# Patient Record
Sex: Female | Born: 1955 | Race: White | Hispanic: No | Marital: Married
Health system: Southern US, Academic
[De-identification: ages and names within clinical notes are randomized; demographics above are authoritative.]

## PROBLEM LIST (undated history)

## (undated) ENCOUNTER — Ambulatory Visit: Payer: MEDICARE

## (undated) ENCOUNTER — Ambulatory Visit: Payer: Medicare (Managed Care) | Attending: Internal Medicine | Primary: Internal Medicine

## (undated) ENCOUNTER — Encounter

## (undated) ENCOUNTER — Telehealth

## (undated) ENCOUNTER — Ambulatory Visit

## (undated) ENCOUNTER — Encounter: Attending: Family Medicine | Primary: Family Medicine

## (undated) ENCOUNTER — Ambulatory Visit: Payer: Medicare (Managed Care) | Attending: Adult Health | Primary: Adult Health

## (undated) ENCOUNTER — Encounter: Attending: Adult Health | Primary: Adult Health

## (undated) ENCOUNTER — Encounter: Attending: Internal Medicine | Primary: Internal Medicine

## (undated) ENCOUNTER — Ambulatory Visit: Attending: Family Medicine | Primary: Family Medicine

## (undated) ENCOUNTER — Ambulatory Visit: Payer: Medicare (Managed Care)

## (undated) DIAGNOSIS — E0500 Thyrotoxicosis with diffuse goiter without thyrotoxic crisis or storm: Secondary | ICD-10-CM

## (undated) DIAGNOSIS — E785 Hyperlipidemia, unspecified: Secondary | ICD-10-CM

## (undated) HISTORY — PX: ROTATOR CUFF REPAIR: SHX139

## (undated) HISTORY — DX: Hyperlipidemia, unspecified: E78.5

## (undated) HISTORY — PX: ABDOMINAL HYSTERECTOMY: SHX81

## (undated) HISTORY — PX: OTHER SURGICAL HISTORY: SHX169

---

## 2007-04-07 ENCOUNTER — Ambulatory Visit: Payer: Self-pay | Admitting: Family Medicine

## 2010-06-28 ENCOUNTER — Ambulatory Visit: Payer: Self-pay | Admitting: Family Medicine

## 2011-03-15 DIAGNOSIS — N952 Postmenopausal atrophic vaginitis: Secondary | ICD-10-CM | POA: Insufficient documentation

## 2014-03-18 DIAGNOSIS — M7700 Medial epicondylitis, unspecified elbow: Secondary | ICD-10-CM | POA: Insufficient documentation

## 2014-09-13 ENCOUNTER — Encounter: Payer: Self-pay | Admitting: Family Medicine

## 2014-09-13 ENCOUNTER — Ambulatory Visit (INDEPENDENT_AMBULATORY_CARE_PROVIDER_SITE_OTHER): Payer: BC Managed Care – PPO | Admitting: Family Medicine

## 2014-09-13 ENCOUNTER — Encounter (INDEPENDENT_AMBULATORY_CARE_PROVIDER_SITE_OTHER): Payer: Self-pay

## 2014-09-13 VITALS — BP 112/72 | HR 86 | Temp 97.7°F | Resp 16 | Ht 59.0 in | Wt 140.1 lb

## 2014-09-13 DIAGNOSIS — J0100 Acute maxillary sinusitis, unspecified: Secondary | ICD-10-CM | POA: Diagnosis not present

## 2014-09-13 DIAGNOSIS — M7060 Trochanteric bursitis, unspecified hip: Secondary | ICD-10-CM | POA: Insufficient documentation

## 2014-09-13 DIAGNOSIS — M549 Dorsalgia, unspecified: Secondary | ICD-10-CM

## 2014-09-13 DIAGNOSIS — M170 Bilateral primary osteoarthritis of knee: Secondary | ICD-10-CM | POA: Insufficient documentation

## 2014-09-13 DIAGNOSIS — G8929 Other chronic pain: Secondary | ICD-10-CM | POA: Insufficient documentation

## 2014-09-13 DIAGNOSIS — N816 Rectocele: Secondary | ICD-10-CM | POA: Insufficient documentation

## 2014-09-13 DIAGNOSIS — J3089 Other allergic rhinitis: Secondary | ICD-10-CM | POA: Insufficient documentation

## 2014-09-13 DIAGNOSIS — K5790 Diverticulosis of intestine, part unspecified, without perforation or abscess without bleeding: Secondary | ICD-10-CM | POA: Insufficient documentation

## 2014-09-13 DIAGNOSIS — H7290 Unspecified perforation of tympanic membrane, unspecified ear: Secondary | ICD-10-CM | POA: Insufficient documentation

## 2014-09-13 DIAGNOSIS — B009 Herpesviral infection, unspecified: Secondary | ICD-10-CM | POA: Insufficient documentation

## 2014-09-13 DIAGNOSIS — N8184 Pelvic muscle wasting: Secondary | ICD-10-CM | POA: Insufficient documentation

## 2014-09-13 DIAGNOSIS — K449 Diaphragmatic hernia without obstruction or gangrene: Secondary | ICD-10-CM | POA: Insufficient documentation

## 2014-09-13 DIAGNOSIS — Z87442 Personal history of urinary calculi: Secondary | ICD-10-CM | POA: Insufficient documentation

## 2014-09-13 DIAGNOSIS — Z90710 Acquired absence of both cervix and uterus: Secondary | ICD-10-CM | POA: Insufficient documentation

## 2014-09-13 DIAGNOSIS — Z780 Asymptomatic menopausal state: Secondary | ICD-10-CM | POA: Insufficient documentation

## 2014-09-13 DIAGNOSIS — Q359 Cleft palate, unspecified: Secondary | ICD-10-CM | POA: Insufficient documentation

## 2014-09-13 MED ORDER — AZITHROMYCIN 500 MG PO TABS: 500.0000 mg | ORAL_TABLET | Freq: Every day | ORAL | Status: DC

## 2014-09-13 MED ORDER — HYDROCOD POLST-CPM POLST ER 10-8 MG/5ML PO SUER: 5.0000 mL | Freq: Two times a day (BID) | ORAL | Status: DC

## 2014-09-13 NOTE — Progress Notes (Signed)
Name: Mia Camacho   MRN: [id number]    DOB: [birth year]   Date:09/13/2014       Progress Note  Subjective  Chief Complaint  Chief Complaint  Patient presents with  . URI    onset 1 week, cough,ear pain and clogged,sore mouth,congestion,swollen glands    HPI  Sinusitis: she had symptoms of cold that started one week ago, symptoms are getting, facial pressure, worse when leaning forward, difficulty hearing from left side, nocturnal cough from post-nasal drainage. No fever, no chills, but she is feeling tired.    Patient Active Problem List   Diagnosis Date Noted  . Allergic rhinitis 09/13/2014  . Back pain, chronic 09/13/2014  . Diverticulosis 09/13/2014  . Bursitis, trochanteric 09/13/2014  . Herpes 09/13/2014  . Hiatal hernia 09/13/2014  . H/O: hysterectomy 09/13/2014  . Personal history of urinary calculi 09/13/2014  . 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 Hernia, rectovaginal 09/13/2014  . Primary osteoarthritis of both knees 09/13/2014  . Pelvic muscle wasting 09/13/2014  . Epicondylitis elbow, medial 03/18/2014  . LBP (low back pain) 06/12/2012  . 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 Acid reflux 06/28/2009  . Vitamin D deficiency 04/05/2009  . Dyslipidemia 02/10/2009  . History of Graves' disease 02/10/2009    Past Surgical History  Procedure Laterality Date  . Abdominal hysterectomy    . Rotator cuff repair    . Bladder tact      Family History  Problem Relation Age of Onset  . Hyperlipidemia Mother   . Hypertension Mother   . Diabetes Mother   . Cancer Father     History   Social History  . Marital Status: Married    Spouse Name: N/A  . Number of Children: N/A  . Years of Education: N/A   Occupational History  . Not on file.   Social History Main Topics  . Smoking status: Never Smoker   . Smokeless tobacco: Never Used  . Alcohol Use: No  . Drug Use: No  . Sexual Activity: Yes   Other Topics Concern  . Not on  file   Social History Narrative  . No narrative on file     Current outpatient prescriptions:  .  Cinnamon 500 MG capsule, Take 500 mg by mouth daily., Disp: , Rfl:  .  co-enzyme Q-10 50 MG capsule, Take 50 mg by mouth daily., Disp: , Rfl:  .  mometasone (NASONEX) 50 MCG/ACT nasal spray, Place 2 sprays into the nose daily., Disp: , Rfl: 6 .  Multiple Vitamin (MULTIVITAMIN) capsule, Take 1 capsule by mouth daily., Disp: , Rfl:  .  Omega-3 1000 MG CAPS, Take 1 tablet by mouth daily., Disp: , Rfl:  .  Red Yeast Rice Extract ([facility] RED YEAST RICE PO), Take by mouth., Disp: , Rfl:  .  rosuvastatin (CRESTOR) 10 MG tablet, Take 1 tablet by mouth daily., Disp: , Rfl: 2 .  vitamin E 1000 UNIT capsule, Take 1,000 Units by mouth daily., Disp: , Rfl:  .  azithromycin (ZITHROMAX) 500 MG tablet, Take 1 tablet (500 mg total) by mouth daily., Disp: 3 tablet, Rfl: 0  Allergies  Allergen Reactions  . Aspirin Rash  . Sulfa Antibiotics Rash  . Penicillins Nausea And Vomiting  . Latex Rash    Other Reaction: WELTS     ROS  Ten systems reviewed and is negative except as mentioned in HPI   Objective  Filed Vitals:   09/13/14 0946  BP:  112/72  Pulse: 86  Temp: 97.7 F (36.5 C)  TempSrc: Oral  Resp: 16  Height: 4\' 11"  (1.499 m)  Weight: 140 lb 1.6 oz (63.549 kg)  SpO2: 96%    Body mass index is 28.28 kg/(m^2).  Physical Exam  Constitutional: Patient appears well-developed and well-nourished. No distress.  Eyes:  No scleral icterus. PERL Nose: bulging turbinates Pharynx: cobblestone formation and post-nasal drainag Ears: scarring on right ear drum, and perforation on left side Sinus: tender to touch worse on right maxillary sinus Neck: Normal range of motion. Neck supple. Cardiovascular: Normal rate, regular rhythm and normal heart sounds.  No murmur heard. No BLE edema. Pulmonary/Chest: Effort normal and breath sounds normal. No respiratory distress. Abdominal: Soft.  There is no  tenderness. Psychiatric: Patient has a normal mood and affect. behavior is normal. Judgment and thought content normal.    PHQ2/9: Depression screen PHQ 2/9 09/13/2014  Decreased Interest 0  Down, Depressed, Hopeless 0  PHQ - 2 Score 0     Fall Risk: Fall Risk  09/13/2014  Falls in the past year? No     Assessment & Plan  1. Acute maxillary sinusitis, recurrence not specified Resume nasal spray, nasal saline, otc medication, cough medication at night and antibiotics   - azithromycin (ZITHROMAX) 500 MG tablet; Take 1 tablet (500 mg total) by mouth daily.  Dispense: 3 tablet; Refill: 0 - chlorpheniramine-HYDROcodone (TUSSIONEX PENNKINETIC ER) 10-8 MG/5ML SUER; Take 5 mLs by mouth 2 (two) times daily.  Dispense: 140 mL; Refill: 0

## 2014-10-18 ENCOUNTER — Ambulatory Visit (INDEPENDENT_AMBULATORY_CARE_PROVIDER_SITE_OTHER): Payer: BC Managed Care – PPO | Admitting: Family Medicine

## 2014-10-18 ENCOUNTER — Encounter: Payer: Self-pay | Admitting: Family Medicine

## 2014-10-18 VITALS — BP 122/60 | HR 97 | Temp 98.1°F | Resp 16 | Ht 59.0 in | Wt 143.9 lb

## 2014-10-18 DIAGNOSIS — J3089 Other allergic rhinitis: Secondary | ICD-10-CM

## 2014-10-18 DIAGNOSIS — Z8639 Personal history of other endocrine, nutritional and metabolic disease: Secondary | ICD-10-CM | POA: Diagnosis not present

## 2014-10-18 DIAGNOSIS — E785 Hyperlipidemia, unspecified: Secondary | ICD-10-CM | POA: Diagnosis not present

## 2014-10-18 DIAGNOSIS — Z79899 Other long term (current) drug therapy: Secondary | ICD-10-CM | POA: Diagnosis not present

## 2014-10-18 NOTE — Progress Notes (Signed)
Name: Mia Camacho   MRN: [id number]    DOB: 1955-05-19   Date:10/18/2014       Progress Note  Subjective  Chief Complaint  Chief Complaint  Patient presents with  . Medication Refill  . Hyperlipidemia    No problems with new medication 1/2 pill of Crestor daily    HPI  Hyperlipidemia: she has been tolerating Crestor , currently on half pill daily, and Co Q 10, she is also taking Red Yeast Rice, she denies myalgias at this time.   History of Graves: she has been off Methimazole for years now, we still monitor her levels, no hair loss, diarrhea, no palpitation.  AR: symptoms are usually are pruritus and congestion. Using steroid nasally prn only and seems to control symptoms.   Patient Active Problem List   Diagnosis Date Noted  . Allergic rhinitis 09/13/2014  . Back pain, chronic 09/13/2014  . Diverticulosis 09/13/2014  . Bursitis, trochanteric 09/13/2014  . Herpes 09/13/2014  . Hiatal hernia 09/13/2014  . H/O: hysterectomy 09/13/2014  . Personal history of urinary calculi 09/13/2014  . Menopause 09/13/2014  . Hernia, rectovaginal 09/13/2014  . Primary osteoarthritis of both knees 09/13/2014  . Pelvic muscle wasting 09/13/2014  . Cleft palate 09/13/2014  . 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/13/2014  . Epicondylitis elbow, medial 03/18/2014  . LBP (low back pain) 06/12/2012  . 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 Gastroesophageal reflux disease with hiatal hernia 06/28/2009  . Vitamin D deficiency 04/05/2009  . Dyslipidemia 02/10/2009  . History of Graves' disease 02/10/2009    Past Surgical History  Procedure Laterality Date  . Abdominal hysterectomy    . Rotator cuff repair    . Bladder tact      Family History  Problem Relation Age of Onset  . Hyperlipidemia Mother   . Hypertension Mother   . Diabetes Mother   . Cancer Father     Social History   Social History  . Marital Status: Married    Spouse Name: N/A  . Number of Children: N/A  . Years of  Education: N/A   Occupational History  . Not on file.   Social History Main Topics  . Smoking status: Never Smoker   . Smokeless tobacco: Never Used  . Alcohol Use: No  . Drug Use: No  . Sexual Activity:    Partners: Male   Other Topics Concern  . Not on file   Social History Narrative     Current outpatient prescriptions:  .  acetaminophen (TYLENOL) 650 MG CR tablet, Take 1 tablet by mouth 2 (two) times daily as needed., Disp: , Rfl:  .  Cholecalciferol (VITAMIN D) 2000 UNITS tablet, Take 1 tablet by mouth daily., Disp: , Rfl:  .  Cinnamon 500 MG capsule, Take 500 mg by mouth daily., Disp: , Rfl:  .  co-enzyme Q-10 50 MG capsule, Take 50 mg by mouth daily., Disp: , Rfl:  .  mometasone (NASONEX) 50 MCG/ACT nasal spray, Place 2 sprays into the nose daily., Disp: , Rfl: 6 .  Multiple Vitamin (MULTIVITAMIN) capsule, Take 1 capsule by mouth daily., Disp: , Rfl:  .  Omega-3 1000 MG CAPS, Take 1 tablet by mouth daily., Disp: , Rfl:  .  polyethylene glycol (MIRALAX / GLYCOLAX) packet, Take 1 packet by mouth daily., Disp: , Rfl:  .  rosuvastatin (CRESTOR) 10 MG tablet, Take 1 tablet by mouth daily., Disp: , Rfl: 2 .  vitamin E 1000 UNIT capsule, Take 1,000 Units by mouth  daily., Disp: , Rfl:   Allergies  Allergen Reactions  . Aspirin Rash  . Sulfa Antibiotics Rash  . Penicillins Nausea And Vomiting  . Latex Rash    Other Reaction: WELTS     ROS  Constitutional: Negative for fever or significant weight change.  Respiratory: Negative for cough and shortness of breath.   Cardiovascular: Negative for chest pain or palpitations.  Gastrointestinal: Negative for abdominal pain, no bowel changes.  Musculoskeletal: Negative for gait problem or joint swelling.  Skin: Negative for rash.  Neurological: Negative for dizziness or headache.  No other specific complaints in a complete review of systems (except as listed in HPI above).  Objective  Filed Vitals:   10/18/14 1535  BP:  122/60  Pulse: 97  Temp: 98.1 F (36.7 C)  TempSrc: Oral  Resp: 16  Height: 4\' 11"  (1.499 m)  Weight: 143 lb 14.4 oz (65.273 kg)  SpO2: 97%    Body mass index is 29.05 kg/(m^2).  Physical Exam  Constitutional: Patient appears well-developed and well-nourished. Obese  No distress.  HEENT: head atraumatic, normocephalic, pupils equal and reactive to light, neck supple, throat within normal limits, scarring in both TM  Cardiovascular: Normal rate, regular rhythm and normal heart sounds.  No murmur heard. No BLE edema. Pulmonary/Chest: Effort normal and breath sounds normal. No respiratory distress. Abdominal: Soft.  There is no tenderness. Psychiatric: Patient has a normal mood and affect. behavior is normal. Judgment and thought content normal.    PHQ2/9: Depression screen PHQ 2/9 09/13/2014  Decreased Interest 0  Down, Depressed, Hopeless 0  PHQ - 2 Score 0    Fall Risk: Fall Risk  09/13/2014  Falls in the past year? No    Assessment & Plan  1. Dyslipidemia Try to increase Crestor to 10 mg daily, if pain / muscle pain occurs back down to 5 mg daily - Lipid panel  2. History of Graves' disease  - Thyroid Panel With TSH  3. Other allergic rhinitis  Continue prn medication   4. Long-term use of high-risk medication  - Comprehensive metabolic panel

## 2014-12-30 ENCOUNTER — Telehealth: Payer: Self-pay | Admitting: Family Medicine

## 2014-12-30 NOTE — Telephone Encounter (Signed)
Pt states she let her lab order run out and wants to know if she can come in on Monday morning to get a new lab order?

## 2014-12-30 NOTE — Telephone Encounter (Signed)
Left voicemail labs slip will be up front for Monday.

## 2015-01-04 LAB — LIPID PANEL
CHOLESTEROL TOTAL: 263 mg/dL — AB (ref 100–199)
Chol/HDL Ratio: 3.7 ratio units (ref 0.0–4.4)
HDL: 72 mg/dL (ref >39)
LDL Calculated: 160 mg/dL — ABNORMAL HIGH (ref 0–99)
Triglycerides: 153 mg/dL — ABNORMAL HIGH (ref 0–149)
VLDL Cholesterol Cal: 31 mg/dL (ref 5–40)

## 2015-01-04 LAB — COMPREHENSIVE METABOLIC PANEL
ALBUMIN: 4.5 g/dL (ref 3.5–5.5)
ALT: 19 IU/L (ref 0–32)
AST: 23 IU/L (ref 0–40)
Albumin/Globulin Ratio: 1.7 (ref 1.1–2.5)
Alkaline Phosphatase: 74 IU/L (ref 39–117)
BUN / CREAT RATIO: 16 (ref 9–23)
BUN: 11 mg/dL (ref 6–24)
Bilirubin Total: 0.3 mg/dL (ref 0.0–1.2)
CALCIUM: 9.6 mg/dL (ref 8.7–10.2)
CO2: 25 mmol/L (ref 18–29)
CREATININE: 0.68 mg/dL (ref 0.57–1.00)
Chloride: 98 mmol/L (ref 97–106)
GFR calc Af Amer: 111 mL/min/{1.73_m2} (ref >59)
GFR, EST NON AFRICAN AMERICAN: 96 mL/min/{1.73_m2} (ref >59)
GLOBULIN, TOTAL: 2.6 g/dL (ref 1.5–4.5)
Glucose: 90 mg/dL (ref 65–99)
Potassium: 4.4 mmol/L (ref 3.5–5.2)
SODIUM: 140 mmol/L (ref 136–144)
Total Protein: 7.1 g/dL (ref 6.0–8.5)

## 2015-01-04 LAB — THYROID PANEL WITH TSH
FREE THYROXINE INDEX: 2.2 (ref 1.2–4.9)
T3 Uptake Ratio: 29 % (ref 24–39)
T4 TOTAL: 7.5 ug/dL (ref 4.5–12.0)
TSH: 1.63 u[IU]/mL (ref 0.450–4.500)

## 2015-01-08 ENCOUNTER — Other Ambulatory Visit: Payer: Self-pay | Admitting: Family Medicine

## 2015-01-31 ENCOUNTER — Other Ambulatory Visit: Payer: Self-pay | Admitting: Family Medicine

## 2015-01-31 NOTE — Telephone Encounter (Signed)
PT NEEDS REFILL ON ROSUVASTATIN CALCIUM 10MG . PHARM IS [facility] [place] [facility] A [facility]

## 2015-02-01 MED ORDER — ROSUVASTATIN CALCIUM 10 MG PO TABS: ORAL_TABLET | ORAL | Status: DC

## 2015-04-22 ENCOUNTER — Encounter: Payer: Self-pay | Admitting: Family Medicine

## 2015-04-22 ENCOUNTER — Ambulatory Visit (INDEPENDENT_AMBULATORY_CARE_PROVIDER_SITE_OTHER): Payer: BC Managed Care – PPO | Admitting: Family Medicine

## 2015-04-22 VITALS — BP 116/64 | HR 86 | Temp 97.6°F | Resp 16 | Ht 59.0 in | Wt 140.8 lb

## 2015-04-22 DIAGNOSIS — M170 Bilateral primary osteoarthritis of knee: Secondary | ICD-10-CM | POA: Diagnosis not present

## 2015-04-22 DIAGNOSIS — M549 Dorsalgia, unspecified: Secondary | ICD-10-CM | POA: Diagnosis not present

## 2015-04-22 DIAGNOSIS — J309 Allergic rhinitis, unspecified: Secondary | ICD-10-CM | POA: Diagnosis not present

## 2015-04-22 DIAGNOSIS — E785 Hyperlipidemia, unspecified: Secondary | ICD-10-CM | POA: Diagnosis not present

## 2015-04-22 DIAGNOSIS — G8929 Other chronic pain: Secondary | ICD-10-CM

## 2015-04-22 DIAGNOSIS — J3089 Other allergic rhinitis: Secondary | ICD-10-CM

## 2015-04-22 DIAGNOSIS — Z1211 Encounter for screening for malignant neoplasm of colon: Secondary | ICD-10-CM

## 2015-04-22 DIAGNOSIS — M7062 Trochanteric bursitis, left hip: Secondary | ICD-10-CM

## 2015-04-22 DIAGNOSIS — E7849 Other hyperlipidemia: Secondary | ICD-10-CM

## 2015-04-22 DIAGNOSIS — M7061 Trochanteric bursitis, right hip: Secondary | ICD-10-CM

## 2015-04-22 DIAGNOSIS — N8184 Pelvic muscle wasting: Secondary | ICD-10-CM

## 2015-04-22 MED ORDER — TIZANIDINE HCL 4 MG PO TABS: 4.0000 mg | ORAL_TABLET | Freq: Three times a day (TID) | ORAL | Status: DC

## 2015-04-22 MED ORDER — MELOXICAM 15 MG PO TABS: 15.0000 mg | ORAL_TABLET | Freq: Every day | ORAL | Status: DC

## 2015-04-22 MED ORDER — ROSUVASTATIN CALCIUM 10 MG PO TABS: ORAL_TABLET | ORAL | Status: DC

## 2015-04-22 MED ORDER — MOMETASONE FUROATE 50 MCG/ACT NA SUSP: 2.0000 | Freq: Every day | NASAL | Status: DC

## 2015-04-22 NOTE — Progress Notes (Signed)
Name: Mia Camacho   MRN: [id number]    DOB: [birth year]   Date:04/22/2015       Progress Note  Subjective  Chief Complaint  Chief Complaint  Patient presents with  . Hyperlipidemia    patient is here for her 72-month f/u  . Medication Refill    nasonex  . Back Pain    HPI  Hyperlipidemia: she has been tolerating Crestor 10mg , currently on half pill daily, and Co Q 10, she denies myalgias at this time. LDL down from 300's to 160's.   History of Graves: she has been off Methimazole for years now, last labs within normal limits , no hair loss, diarrhea, no palpitation.  AR: symptoms are usually are pruritus and congestion. Using steroid nasally prn only and seems to control symptoms.  Chronic Back pain: she has a long history of low back pain, used to be intermittent, however over the past 6 weeks the pain has been constant - started after she tried using an exercise bike. 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 She has intermittent tingling on her toes when she walks.   Trochanteric bursitis: she has been having problems sleeping at night because of pain on her outer hips. She took Tizanidine last night so she could sleep.    Patient Active Problem List   Diagnosis Date Noted  . Perennial allergic rhinitis 09/13/2014  . Back pain, chronic 09/13/2014  . Diverticulosis 09/13/2014  . Bursitis, trochanteric 09/13/2014  . Herpes 09/13/2014  . Hiatal hernia 09/13/2014  . H/O: hysterectomy 09/13/2014  . Personal history of urinary calculi 09/13/2014  . Menopause 09/13/2014  . Hernia, rectovaginal 09/13/2014  . Primary osteoarthritis of both knees 09/13/2014  . Pelvic muscle wasting 09/13/2014  . Cleft palate 09/13/2014  . 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/13/2014  . Epicondylitis elbow, medial 03/18/2014  . 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 Gastroesophageal reflux disease with hiatal hernia 06/28/2009  . Vitamin D deficiency 04/05/2009  . Familial  hyperlipidemia, high LDL 02/10/2009  . History of Graves' disease 02/10/2009    Past Surgical History  Procedure Laterality Date  . Abdominal hysterectomy    . Rotator cuff repair    . Bladder tact      Family History  Problem Relation Age of Onset  . Hyperlipidemia Mother   . Hypertension Mother   . Diabetes Mother   . Cancer Father     Social History   Social History  . Marital Status: Married    Spouse Name: N/A  . Number of Children: N/A  . Years of Education: N/A   Occupational History  . Not on file.   Social History Main Topics  . Smoking status: Never Smoker   . Smokeless tobacco: Never Used  . Alcohol Use: No  . Drug Use: No  . Sexual Activity:    Partners: Male   Other Topics Concern  . Not on file   Social History Narrative     Current outpatient prescriptions:  .  Cholecalciferol (VITAMIN D) 2000 UNITS tablet, Take 1 tablet by mouth daily., Disp: , Rfl:  .  Cinnamon 500 MG capsule, Take 2,000 mg by mouth daily. , Disp: , Rfl:  .  co-enzyme Q-10 50 MG capsule, Take 200 mg by mouth daily. , Disp: , Rfl:  .  mometasone (NASONEX) 50 MCG/ACT nasal spray, Place 2 sprays into the nose daily., Disp: 17 g, Rfl: 6 .  Multiple Vitamin (MULTIVITAMIN) capsule, Take 1 capsule  by mouth daily. , Disp: , Rfl:  .  Omega-3 1000 MG CAPS, Take 1,200 mg by mouth daily. , Disp: , Rfl:  .  polyethylene glycol (MIRALAX / GLYCOLAX) packet, Take 1 packet by mouth daily., Disp: , Rfl:  .  rosuvastatin (CRESTOR) 10 MG tablet, TAKE 1 TABLET BY MOUTH EVERY DAY FOR CHOLESTEROL, Disp: 90 tablet, Rfl: 1 .  tiZANidine (ZANAFLEX) 4 MG tablet, Take 1 tablet (4 mg total) by mouth 3 (three) times daily., Disp: 90 tablet, Rfl: 1 .  vitamin E 1000 UNIT capsule, Take 400 Units by mouth daily. , Disp: , Rfl:  .  meloxicam (MOBIC) 15 MG tablet, Take 1 tablet (15 mg total) by mouth daily., Disp: 90 tablet, Rfl: 0  Allergies  Allergen Reactions  . Aspirin Rash  . Sulfa Antibiotics Rash  .  Penicillins Nausea And Vomiting  . Latex Rash    Other Reaction: WELTS     ROS  Constitutional: Negative for fever , mild weight change.  Respiratory: Negative for cough and shortness of breath.   Cardiovascular: Negative for chest pain or palpitations.  Gastrointestinal: Negative for abdominal pain, no bowel changes.  Musculoskeletal: Negative for gait problem or joint swelling.  Skin: Negative for rash.  Neurological: Negative for dizziness or headache.  No other specific complaints in a complete review of systems (except as listed in HPI above).  Objective  Filed Vitals:   04/22/15 0921  BP: 116/64  Pulse: 86  Temp: 97.6 F (36.4 C)  TempSrc: Oral  Resp: 16  Height:  (1.499 m)  Weight: 140 lb 12.8 oz (63.866 kg)  SpO2: 97%    Body mass index is 28.42 kg/(m^2).  Physical Exam  Constitutional: Patient appears well-developed and well-nourished. Obese  No distress.  HEENT: head atraumatic, normocephalic, pupils equal and reactive to light,  neck supple, throat within normal limits Cardiovascular: Normal rate, regular rhythm and normal heart sounds.  No murmur heard. No BLE edema. Pulmonary/Chest: Effort normal and breath sounds normal. No respiratory distress. Abdominal: Soft.  There is no tenderness. Psychiatric: Patient has a normal mood and affect. behavior is normal. Judgment and thought content normal. Muscular Skeletal: low back pain, negative straight leg raise, pain during palpation of both trochanteric bursa  PHQ2/9: Depression screen [facility] 2/9 04/22/2015 09/13/2014  Decreased Interest 0 0  Down, Depressed, Hopeless 0 0  PHQ - 2 Score 0 0     Fall Risk: Fall Risk  04/22/2015 09/13/2014  Falls in the past year? No No     Functional Status Survey: Is the patient deaf or have difficulty hearing?: No Does the patient have difficulty seeing, even when wearing glasses/contacts?: Yes (patient will be seeing her eye doctor due to current rx glasses are not  helping) Does the patient have difficulty concentrating, remembering, or making decisions?: No Does the patient have difficulty walking or climbing stairs?: No Does the patient have difficulty dressing or bathing?: No Does the patient have difficulty doing errands alone such as visiting a doctor's office or shopping?: No    Assessment & Plan  1. Back pain, chronic  - tiZANidine (ZANAFLEX) 4 MG tablet; Take 1 tablet (4 mg total) by mouth 3 (three) times daily.  Dispense: 90 tablet; Refill: 1  2. Trochanteric bursitis of both hips  Bilaterally, discussed steroid injection, but she would try Meloxicam first, and if no improvement she will call back   3. Familial hyperlipidemia, high LDL  - rosuvastatin (CRESTOR) 10 MG tablet; TAKE 1  TABLET BY MOUTH EVERY DAY FOR CHOLESTEROL  Dispense: 90 tablet; Refill: 1  4. Primary osteoarthritis of both knees  - meloxicam (MOBIC) 15 MG tablet; Take 1 tablet (15 mg total) by mouth daily.  Dispense: 90 tablet; Refill: 0  5. Perennial allergic rhinitis  - mometasone (NASONEX) 50 MCG/ACT nasal spray; Place 2 sprays into the nose daily.  Dispense: 17 g; Refill: 6  6. Pelvic muscle wasting  Seen by Urologist, offered Pessary or surgery, but she has been doing well, using Miralax and no longer has problems with bowel movements.

## 2015-04-22 NOTE — Patient Instructions (Signed)
Trochanteric Bursitis You have hip pain due to trochanteric bursitis. Bursitis means that the sack near the outside of the hip is filled with fluid and inflamed. This sack is made up of protective soft tissue. The pain from trochanteric bursitis can be severe and keep you from sleep. It can radiate to the buttocks or down the outside of the thigh to the knee. The pain is almost always worse when rising from the seated or lying position and with walking. Pain can improve after you take a few steps. It happens more often in people with hip joint and lumbar spine problems, such as arthritis or previous surgery. Very rarely the trochanteric bursa can become infected, and antibiotics and/or surgery may be needed. Treatment often includes an injection of local anesthetic mixed with cortisone medicine. This medicine is injected into the area where it is most tender over the hip. Repeat injections may be necessary if the response to treatment is slow. You can apply ice packs over the tender area for 30 minutes every 2 hours for the next few days. Anti-inflammatory and/or narcotic pain medicine may also be helpful. Limit your activity for the next few days if the pain continues. See your caregiver in 5-10 days if you are not greatly improved.  SEEK IMMEDIATE MEDICAL CARE IF:  You develop severe pain, fever, or increased redness.  You have pain that radiates below the knee. EXERCISES STRETCHING EXERCISES - Trochanteric Bursitis  These exercises may help you when beginning to rehabilitate your injury. Your symptoms may resolve with or without further involvement from your physician, physical therapist, or athletic trainer. While completing these exercises, remember:   Restoring tissue flexibility helps normal motion to return to the joints. This allows healthier, less painful movement and activity.  An effective stretch should be held for at least 30 seconds.  A stretch should never be painful. You should only  feel a gentle lengthening or release in the stretched tissue. STRETCH - Iliotibial Band  On the floor or bed, lie on your side so your injured leg is on top. Bend your knee and grab your ankle.  Slowly bring your knee back so that your thigh is in line with your trunk. Keep your heel at your buttocks and gently arch your back so your head, shoulders and hips line up.  Slowly lower your leg so that your knee approaches the floor/bed until you feel a gentle stretch on the outside of your thigh. If you do not feel a stretch and your knee will not fall farther, place the heel of your opposite foot on top of your knee and pull your thigh down farther.  Hold this stretch for __________ seconds.  Repeat __________ times. Complete this exercise __________ times per day. STRETCH - Hamstrings, Supine   Lie on your back. Loop a belt or towel over the ball of your foot as shown.  Straighten your knee and slowly pull on the belt to raise your injured leg. Do not allow the knee to bend. Keep your opposite leg flat on the floor.  Raise the leg until you feel a gentle stretch behind your knee or thigh. Hold this position for __________ seconds.  Repeat __________ times. Complete this stretch __________ times per day. STRETCH - Quadriceps, Prone   Lie on your stomach on a firm surface, such as a bed or padded floor.  Bend your knee and grasp your ankle. If you are unable to reach your ankle or pant leg, use a belt   around your foot to lengthen your reach.  Gently pull your heel toward your buttocks. Your knee should not slide out to the side. You should feel a stretch in the front of your thigh and/or knee.  Hold this position for __________ seconds.  Repeat __________ times. Complete this stretch __________ times per day. STRETCHING - Hip Flexors, Lunge Half kneel with your knee on the floor and your opposite knee bent and directly over your ankle.  Keep good posture with your head over your  shoulders. Tighten your buttocks to point your tailbone downward; this will prevent your back from arching too much.  You should feel a gentle stretch in the front of your thigh and/or hip. If you do not feel any resistance, slightly slide your opposite foot forward and then slowly lunge forward so your knee once again lines up over your ankle. Be sure your tailbone remains pointed downward.  Hold this stretch for __________ seconds.  Repeat __________ times. Complete this stretch __________ times per day. STRETCH - Adductors, Lunge  While standing, spread your legs.  Lean away from your injured leg by bending your opposite knee. You may rest your hands on your thigh for balance.  You should feel a stretch in your inner thigh. Hold for __________ seconds.  Repeat __________ times. Complete this exercise __________ times per day.   This information is not intended to replace advice given to you by your health care provider. Make sure you discuss any questions you have with your health care provider.   Document Released: 03/29/2004 Document Revised: 07/06/2014 Document Reviewed: 06/03/2008 Elsevier Interactive Patient Education 2016 [employer] Inc[employer] Hip Bursitis Bursitis is a swelling and soreness (inflammation) of a fluid-filled sac (bursa). This sac overlies and protects the joints.  CAUSES   Injury.  Overuse of the muscles surrounding the joint.  Arthritis.  Gout.  Infection.  Cold weather.  Inadequate warm-up and conditioning prior to activities. The cause may not be known.  SYMPTOMS   Mild to severe irritation.  Tenderness and swelling over the outside of the hip.  Pain with motion of the hip.  If the bursa becomes infected, a fever may be present. Redness, tenderness, and warmth will develop over the hip. Symptoms usually lessen in 3 to 4 weeks with treatment, but can come back. TREATMENT If conservative treatment does not work, your caregiver may advise draining  the bursa and injecting cortisone into the area. This may speed up the healing process. This may also be used as an initial treatment of choice. HOME CARE INSTRUCTIONS   Apply ice to the affected area for 15-20 minutes every 3 to 4 hours while awake for the first 2 days. Put the ice in a plastic bag and place a towel between the bag of ice and your skin.  Rest the painful joint as much as possible, but continue to put the joint through a normal range of motion at least 4 times per day. When the pain lessens, begin normal, slow movements and usual activities to help prevent stiffness of the hip.  Only take over-the-counter or prescription medicines for pain, discomfort, or fever as directed by your caregiver.  Use crutches to limit weight bearing on the hip joint, if advised.  Elevate your painful hip to reduce swelling. Use pillows for propping and cushioning your legs and hips.  Gentle massage may provide comfort and decrease swelling. SEEK IMMEDIATE MEDICAL CARE IF:   Your pain increases even during treatment, or you are not improving.  You have a fever.  You have heat and inflammation over the involved bursa.  You have any other questions or concerns. MAKE SURE YOU:   Understand these instructions.  Will watch your condition.  Will get help right away if you are not doing well or get worse.   This information is not intended to replace advice given to you by your health care provider. Make sure you discuss any questions you have with your health care provider.   Document Released: 08/11/2001 Document Revised: 05/14/2011 Document Reviewed: 09/21/2014 Elsevier Interactive Patient Education [employer]

## 2015-05-20 ENCOUNTER — Telehealth: Payer: Self-pay | Admitting: Family Medicine

## 2015-05-20 NOTE — Telephone Encounter (Signed)
Patient was referred to Dr. Servando SnareWohl for a colonoscopy, but she contacted her insurance company and the out of pocket will be to much.  Therefore she would like to go to [facility] to see either Dr. Mechele CollinElliott or Dr. Bluford Kaufmannh (if possible).  Per patient insurance will cover at [facility] [phone] ([facility] #).  Patient also stated that she has rectocele.  Please contact patient once the referral has been reprocessed.

## 2015-05-25 NOTE — Telephone Encounter (Signed)
Notified patient I have sent her referral to [facility] asking them to schedule her colonoscopy at [facility], [facility], [facility]) and they should contact her to schedule the appt.

## 2015-07-22 ENCOUNTER — Encounter: Payer: Self-pay | Admitting: Family Medicine

## 2015-07-22 ENCOUNTER — Ambulatory Visit (INDEPENDENT_AMBULATORY_CARE_PROVIDER_SITE_OTHER): Payer: BC Managed Care – PPO | Admitting: Family Medicine

## 2015-07-22 VITALS — BP 106/64 | HR 90 | Temp 97.9°F | Resp 18 | Ht 59.0 in | Wt 140.4 lb

## 2015-07-22 DIAGNOSIS — Z1211 Encounter for screening for malignant neoplasm of colon: Secondary | ICD-10-CM

## 2015-07-22 DIAGNOSIS — Z0000 Encounter for general adult medical examination without abnormal findings: Secondary | ICD-10-CM

## 2015-07-22 DIAGNOSIS — Z1239 Encounter for other screening for malignant neoplasm of breast: Secondary | ICD-10-CM | POA: Diagnosis not present

## 2015-07-22 DIAGNOSIS — Z01419 Encounter for gynecological examination (general) (routine) without abnormal findings: Secondary | ICD-10-CM

## 2015-07-22 NOTE — Progress Notes (Signed)
Name: Mia Camacho   MRN: [id number]    DOB: 06-Jan-1956   Date:07/22/2015       Progress Note  Subjective  Chief Complaint  Chief Complaint  Patient presents with  . Annual Exam    HPI  Well Woman: she is feeling good, still has a rectocele, no urinary symptoms. Due for mammogram, and will have colonoscopy done by Dr. Mechele CollinElliott on July 26th, 2017. She has hot flashes and night sweats but controlled with dietary modification -avoid spicy food and caffeine  Patient Active Problem List   Diagnosis Date Noted  . Perennial allergic rhinitis 09/13/2014  . Back pain, chronic 09/13/2014  . Diverticulosis 09/13/2014  . Bursitis, trochanteric 09/13/2014  . Herpes 09/13/2014  . Hiatal hernia 09/13/2014  . H/O: hysterectomy 09/13/2014  . Personal history of urinary calculi 09/13/2014  . Menopause 09/13/2014  . Hernia, rectovaginal 09/13/2014  . Primary osteoarthritis of both knees 09/13/2014  . Pelvic muscle wasting 09/13/2014  . Cleft palate 09/13/2014  . 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/13/2014  . Epicondylitis elbow, medial 03/18/2014  . 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 Gastroesophageal reflux disease with hiatal hernia 06/28/2009  . Vitamin D deficiency 04/05/2009  . Familial hyperlipidemia, high LDL 02/10/2009  . History of Graves' disease 02/10/2009    Past Surgical History  Procedure Laterality Date  . Abdominal hysterectomy    . Rotator cuff repair    . Bladder tact      Family History  Problem Relation Age of Onset  . Hyperlipidemia Mother   . Hypertension Mother   . Diabetes Mother   . Cancer Father     Social History   Social History  . Marital Status: Married    Spouse Name: N/A  . Number of Children: N/A  . Years of Education: N/A   Occupational History  . Not on file.   Social History Main Topics  . Smoking status: Never Smoker   . Smokeless tobacco: Never Used  . Alcohol Use: No  . Drug Use: No  . Sexual Activity:    Partners: Male   Other  Topics Concern  . Not on file   Social History Narrative     Current outpatient prescriptions:  .  Cholecalciferol (VITAMIN D) 2000 UNITS tablet, Take 1 tablet by mouth daily., Disp: , Rfl:  .  co-enzyme Q-10 50 MG capsule, Take 200 mg by mouth daily. , Disp: , Rfl:  .  meloxicam (MOBIC) 15 MG tablet, Take 1 tablet (15 mg total) by mouth daily., Disp: 90 tablet, Rfl: 0 .  mometasone (NASONEX) 50 MCG/ACT nasal spray, Place 2 sprays into the nose daily., Disp: 17 g, Rfl: 6 .  Multiple Vitamin (MULTIVITAMIN) capsule, Take 1 capsule by mouth daily. , Disp: , Rfl:  .  Omega-3 1000 MG CAPS, Take 1,200 mg by mouth daily. , Disp: , Rfl:  .  polyethylene glycol (MIRALAX / GLYCOLAX) packet, Take 1 packet by mouth daily., Disp: , Rfl:  .  rosuvastatin (CRESTOR) 10 MG tablet, TAKE 1 TABLET BY MOUTH EVERY DAY FOR CHOLESTEROL, Disp: 90 tablet, Rfl: 1 .  tiZANidine (ZANAFLEX) 4 MG tablet, Take 1 tablet (4 mg total) by mouth 3 (three) times daily., Disp: 90 tablet, Rfl: 1 .  vitamin E 1000 UNIT capsule, Take 400 Units by mouth daily. , Disp: , Rfl:   Allergies  Allergen Reactions  . Aspirin Rash  . Sulfa Antibiotics Rash  . Penicillins Nausea And Vomiting  . Latex Rash  Other Reaction: WELTS     ROS  Constitutional: Negative for fever or weight change.  Respiratory: Negative for cough and shortness of breath.   Cardiovascular: Negative for chest pain or palpitations.  Gastrointestinal: Negative for abdominal pain, no bowel changes. Occasionally has dysphagia and will discuss with Dr. Mechele Collin Musculoskeletal: Negative for gait problem or joint swelling.  Skin: Negative for rash.  Neurological: Negative for dizziness or headache.  No other specific complaints in a complete review of systems (except as listed in HPI above).  Objective  Filed Vitals:   07/22/15 0842  BP: 106/64  Pulse: 90  Temp: 97.9 F (36.6 C)  TempSrc: Oral  Resp: 18  Height:  (1.499 m)  Weight: 140 lb 6.4  oz (63.685 kg)  SpO2: 98%    Body mass index is 28.34 kg/(m^2).  Physical Exam  Constitutional: Patient appears well-developed and obese. No distress.  HENT: Head: Normocephalic and atraumatic. Ears: B TMs ok, no erythema or effusion; Nose: Nose normal. Mouth/Throat: Oropharynx is clear and moist. No oropharyngeal exudate.  Eyes: Conjunctivae and EOM are normal. Pupils are equal, round, and reactive to light. No scleral icterus.  Neck: Normal range of motion. Neck supple. No JVD present. No thyromegaly present.  Cardiovascular: Normal rate, regular rhythm and normal heart sounds.  No murmur heard. No BLE edema. Pulmonary/Chest: Effort normal and breath sounds normal. No respiratory distress. Abdominal: Soft. Bowel sounds are normal, no distension. There is no tenderness. no masses Breast: no lumps or masses, no nipple discharge or rashes FEMALE GENITALIA:  External genitalia normal External urethra normal Vaginal va ult normal without discharge or lesions Cervix normal without discharge or lesions Bimanual exam normal without masses Cervix absent, large rectocele RECTAL: not done Musculoskeletal: Normal range of motion, no joint effusions. No gross deformities Neurological: he is alert and oriented to person, place, and time. No cranial nerve deficit. Coordination, balance, strength, speech and gait are normal.  Skin: Skin is warm and dry. No rash noted. No erythema.  Psychiatric: Patient has a normal mood and affect. behavior is normal. Judgment and thought content normal.  PHQ2/9: Depression screen [facility] 2/9 07/22/2015 04/22/2015 09/13/2014  Decreased Interest 0 0 0  Down, Depressed, Hopeless 0 0 0  PHQ - 2 Score 0 0 0     Fall Risk: Fall Risk  07/22/2015 04/22/2015 09/13/2014  Falls in the past year? No No No    Functional Status Survey: Is the patient deaf or have difficulty hearing?: No Does the patient have difficulty seeing, even when wearing glasses/contacts?: No Does the  patient have difficulty concentrating, remembering, or making decisions?: No Does the patient have difficulty walking or climbing stairs?: No Does the patient have difficulty dressing or bathing?: No Does the patient have difficulty doing errands alone such as visiting a doctor's office or shopping?: No    Assessment & Plan  1. Well woman exam  Discussed importance of 150 minutes of physical activity weekly, eat two servings of fish weekly, eat one serving of tree nuts ( cashews, pistachios, pecans, almonds.Marland Kitchen) every other day, eat 6 servings of fruit/vegetables daily and drink plenty of water and avoid sweet beverages.   2. Breast cancer screening  - MM Digital Screening; Future  3. Encounter for screening colonoscopy  She has an appointment with Dr. Mechele Collin

## 2015-08-19 ENCOUNTER — Other Ambulatory Visit: Payer: Self-pay | Admitting: Family Medicine

## 2015-09-21 ENCOUNTER — Other Ambulatory Visit: Payer: Self-pay | Admitting: Unknown Physician Specialty

## 2015-09-21 DIAGNOSIS — R1310 Dysphagia, unspecified: Secondary | ICD-10-CM

## 2015-09-26 ENCOUNTER — Ambulatory Visit
Admission: RE | Admit: 2015-09-26 | Discharge: 2015-09-26 | Disposition: A | Discharge status: Home / Self Care | Payer: BC Managed Care – PPO | Source: Ambulatory Visit | Attending: Unknown Physician Specialty | Admitting: Unknown Physician Specialty

## 2015-09-26 DIAGNOSIS — R1310 Dysphagia, unspecified: Secondary | ICD-10-CM | POA: Diagnosis present

## 2015-09-26 DIAGNOSIS — K219 Gastro-esophageal reflux disease without esophagitis: Secondary | ICD-10-CM | POA: Diagnosis not present

## 2015-09-28 LAB — HM COLONOSCOPY

## 2015-10-25 ENCOUNTER — Other Ambulatory Visit: Payer: Self-pay | Admitting: Family Medicine

## 2015-10-25 ENCOUNTER — Other Ambulatory Visit: Payer: Self-pay

## 2015-10-25 ENCOUNTER — Ambulatory Visit (INDEPENDENT_AMBULATORY_CARE_PROVIDER_SITE_OTHER): Payer: BC Managed Care – PPO | Admitting: Family Medicine

## 2015-10-25 ENCOUNTER — Encounter: Payer: Self-pay | Admitting: Family Medicine

## 2015-10-25 ENCOUNTER — Telehealth: Payer: Self-pay

## 2015-10-25 VITALS — BP 108/68 | HR 86 | Temp 98.6°F | Resp 16 | Ht 59.5 in | Wt 141.2 lb

## 2015-10-25 DIAGNOSIS — Z8639 Personal history of other endocrine, nutritional and metabolic disease: Secondary | ICD-10-CM

## 2015-10-25 DIAGNOSIS — E7849 Other hyperlipidemia: Secondary | ICD-10-CM

## 2015-10-25 DIAGNOSIS — M79661 Pain in right lower leg: Secondary | ICD-10-CM | POA: Diagnosis not present

## 2015-10-25 DIAGNOSIS — M25561 Pain in right knee: Secondary | ICD-10-CM

## 2015-10-25 DIAGNOSIS — Z79899 Other long term (current) drug therapy: Secondary | ICD-10-CM

## 2015-10-25 DIAGNOSIS — E785 Hyperlipidemia, unspecified: Secondary | ICD-10-CM

## 2015-10-25 NOTE — Telephone Encounter (Signed)
Patient states Dr. Carlynn PurlSowles usually checks her Thyroid and Cholesterol levels through blood and would like her blood work done before her next appointment. Please order and print, and I will mail lab slip to patient house.

## 2015-10-25 NOTE — Progress Notes (Signed)
Name: Mia Camacho   MRN: [id number]    DOB: January 11, 1956   Date:10/25/2015       Progress Note  Subjective  Chief Complaint  Chief Complaint  Patient presents with  . Leg Pain    Onset-2 weeks, right lower leg pain on the side of her knee. Patient denies any trauma to area and states it will happen intermittently causing sharp, stinging pain to the area. Patient states it has become increasing frequent and wanted to be checked out.     HPI  She states that over the past 6 months she has noticed a dull pain on right popliteal fossa when walking for a prolonged period of time, also some tightness when flexing her right knee, and occasional right knee swelling. Over the past two weeks pain has been on the right lateral upper calf, near the knee, stinging like and initially was brief and yesterday it was not as intense but lasted for hours, so she decided to come in to be evaluated.   Patient Active Problem List   Diagnosis Date Noted  . Perennial allergic rhinitis 09/13/2014  . Back pain, chronic 09/13/2014  . Diverticulosis 09/13/2014  . Bursitis, trochanteric 09/13/2014  . Herpes 09/13/2014  . Hiatal hernia 09/13/2014  . H/O: hysterectomy 09/13/2014  . Personal history of urinary calculi 09/13/2014  . Menopause 09/13/2014  . Hernia, rectovaginal 09/13/2014  . Primary osteoarthritis of both knees 09/13/2014  . Pelvic muscle wasting 09/13/2014  . Cleft palate 09/13/2014  . 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/13/2014  . Epicondylitis elbow, medial 03/18/2014  . 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 Gastroesophageal reflux disease with hiatal hernia 06/28/2009  . Vitamin D deficiency 04/05/2009  . Familial hyperlipidemia, high LDL 02/10/2009  . History of Graves' disease 02/10/2009    Past Surgical History:  Procedure Laterality Date  . ABDOMINAL HYSTERECTOMY    . bladder tact    . ROTATOR CUFF REPAIR      Family History  Problem Relation Age of Onset  . Hyperlipidemia Mother   .  Hypertension Mother   . Diabetes Mother   . Cancer Father     Social History   Social History  . Marital status: Married    Spouse name: N/A  . Number of children: N/A  . Years of education: N/A   Occupational History  . Not on file.   Social History Main Topics  . Smoking status: Never Smoker  . Smokeless tobacco: Never Used  . Alcohol use No  . Drug use: No  . Sexual activity: Yes    Partners: Male   Other Topics Concern  . Not on file   Social History Narrative  . No narrative on file     Current Outpatient Prescriptions:  .  co-enzyme Q-10 50 MG capsule, Take 200 mg by mouth daily. , Disp: , Rfl:  .  meloxicam (MOBIC) 15 MG tablet, TAKE 1 TABLET (15 MG TOTAL) BY MOUTH DAILY., Disp: 90 tablet, Rfl: 0 .  mometasone (NASONEX) 50 MCG/ACT nasal spray, Place 2 sprays into the nose daily., Disp: 17 g, Rfl: 6 .  Multiple Vitamin (MULTIVITAMIN) capsule, Take 1 capsule by mouth daily. , Disp: , Rfl:  .  Omega-3 1000 MG CAPS, Take 1,200 mg by mouth daily. , Disp: , Rfl:  .  polyethylene glycol (MIRALAX / GLYCOLAX) packet, Take 1 packet by mouth daily., Disp: , Rfl:  .  rosuvastatin (CRESTOR) 10 MG tablet, TAKE 1 TABLET BY MOUTH EVERY DAY FOR CHOLESTEROL,  Disp: 90 tablet, Rfl: 1 .  tiZANidine (ZANAFLEX) 4 MG tablet, Take 1 tablet (4 mg total) by mouth 3 (three) times daily., Disp: 90 tablet, Rfl: 1 .  vitamin E 1000 UNIT capsule, Take 400 Units by mouth daily. , Disp: , Rfl:   Allergies  Allergen Reactions  . Aspirin Rash  . Sulfa Antibiotics Rash  . Penicillins Nausea And Vomiting  . Latex Rash    Other Reaction: WELTS     ROS  Ten systems reviewed and is negative except as mentioned in HPI   Objective  Vitals:   10/25/15 1548  BP: 108/68  Pulse: 86  Resp: 16  Temp: 98.6 F (37 C)  TempSrc: Oral  SpO2: 97%  Weight: 141 lb 3.2 oz (64 kg)  Height: 4' 11.5" (1.511 m)    Body mass index is 28.04 kg/m.  Physical Exam  Constitutional: Patient appears  well-developed and well-nourished. Obese  No distress.  HEENT: head atraumatic, normocephalic,neck supple, throat within normal limits Cardiovascular: Normal rate, regular rhythm and normal heart sounds.  No murmur heard. No BLE edema. Pulmonary/Chest: Effort normal and breath sounds normal. No respiratory distress. Abdominal: Soft.  There is no tenderness. Psychiatric: Patient has a normal mood and affect. behavior is normal. Judgment and thought content normal. Muscular Skeletal: mild effusion of knee, no redness, pain during McMurray maneuver on the lateral aspect, no calf pain during palpation, no leg edema  Recent Results (from the past 2160 hour(s))  [facility] COLONOSCOPY     Status: None   Collection Time: 09/28/15 12:00 AM  Result Value Ref Range   [facility] Colonoscopy Patient Reported See Report (in chart), Patient Reported    Comment: Normal-Dr. Kathrynn RunningElliott [facility]      PHQ2/9: [facility] 2/9 10/25/2015 07/22/2015 04/22/2015 09/13/2014  Decreased Interest 0 0 0 0  Down, Depressed, Hopeless 0 0 0 0  PHQ - 2 Score 0 0 0 0     Fall Risk: Fall Risk  10/25/2015 07/22/2015 04/22/2015 09/13/2014  Falls in the past year? No No No No     Functional Status Survey: Is the patient deaf or have difficulty hearing?: No Does the patient have difficulty seeing, even when wearing glasses/contacts?: No Does the patient have difficulty concentrating, remembering, or making decisions?: No Does the patient have difficulty walking or climbing stairs?: No Does the patient have difficulty dressing or bathing?: No Does the patient have difficulty doing errands alone such as visiting a doctor's office or shopping?: No    Assessment & Plan  1. Right calf pain  Normal exam   2. Right knee pain  OA versus meniscal tear , continue meloxicam and referral to Ortho ( discussed risk of GI/kidney side effects with meloxicam and importance of taking the lowest dose for the shortest period of  time) - Ambulatory referral to Orthopedic Surgery

## 2015-10-25 NOTE — Telephone Encounter (Signed)
done

## 2015-10-25 NOTE — Telephone Encounter (Signed)
Mailed lab slip to patient

## 2015-11-18 ENCOUNTER — Encounter: Payer: Self-pay | Admitting: Family Medicine

## 2015-11-22 DIAGNOSIS — M25561 Pain in right knee: Secondary | ICD-10-CM | POA: Insufficient documentation

## 2015-11-25 ENCOUNTER — Encounter: Payer: Self-pay | Admitting: Family Medicine

## 2015-11-25 ENCOUNTER — Ambulatory Visit (INDEPENDENT_AMBULATORY_CARE_PROVIDER_SITE_OTHER): Payer: BC Managed Care – PPO | Admitting: Family Medicine

## 2015-11-25 VITALS — BP 120/74 | HR 89 | Temp 98.3°F | Resp 18 | Ht 60.0 in | Wt 140.6 lb

## 2015-11-25 DIAGNOSIS — J309 Allergic rhinitis, unspecified: Secondary | ICD-10-CM | POA: Diagnosis not present

## 2015-11-25 DIAGNOSIS — M549 Dorsalgia, unspecified: Secondary | ICD-10-CM

## 2015-11-25 DIAGNOSIS — M25561 Pain in right knee: Secondary | ICD-10-CM | POA: Diagnosis not present

## 2015-11-25 DIAGNOSIS — J3089 Other allergic rhinitis: Secondary | ICD-10-CM

## 2015-11-25 DIAGNOSIS — E7849 Other hyperlipidemia: Secondary | ICD-10-CM

## 2015-11-25 DIAGNOSIS — G8929 Other chronic pain: Secondary | ICD-10-CM | POA: Diagnosis not present

## 2015-11-25 DIAGNOSIS — E785 Hyperlipidemia, unspecified: Secondary | ICD-10-CM

## 2015-11-25 DIAGNOSIS — Z8639 Personal history of other endocrine, nutritional and metabolic disease: Secondary | ICD-10-CM | POA: Diagnosis not present

## 2015-11-25 MED ORDER — ROSUVASTATIN CALCIUM 10 MG PO TABS: ORAL_TABLET | ORAL | 1 refills | Status: DC

## 2015-11-25 NOTE — Progress Notes (Signed)
Name: Mia Camacho   MRN: [id number]    DOB: October 17, 1955   Date:11/25/2015       Progress Note  Subjective  Chief Complaint  Chief Complaint  Patient presents with  . Hyperlipidemia    4 mnth follow up   . Follow-up    Thyroid lab results     HPI  Hyperlipidemia: she has been tolerating Crestor 10mg , currently on half pill daily, and Co Q 10, she denies myalgias at this time. LDL down from 300's to 132. She is doing well.   History of Graves: she has been off Methimazole for years now, last labs within normal limits, no hair loss, diarrhea, no palpitation. She has noticed some palpitation lately, any time of the day, almost daily - she states not associated with chest pain or SOB - she has been under more stress, mother has Alzheimer's and is getting a little worse  AR: symptoms are usually are pruritus and congestion. Using steroid nasally prn only and seems to control symptoms.  Chronic Back pain: she has a long history of low back pain, she states pain is daily, but not very intense, average of 2/10. Taking medication prn. She states tingling on her toes has resolved  Right knee pain: seen by Ortho at [facility] and had normal x-rays, taking Meloxicam prn, and knee pain has improved  Patient Active Problem List   Diagnosis Date Noted  . Acute pain of right knee 11/22/2015  . Perennial allergic rhinitis 09/13/2014  . Back pain, chronic 09/13/2014  . Diverticulosis 09/13/2014  . Bursitis, trochanteric 09/13/2014  . Herpes 09/13/2014  . Hiatal hernia 09/13/2014  . H/O: hysterectomy 09/13/2014  . Personal history of urinary calculi 09/13/2014  . Menopause 09/13/2014  . Hernia, rectovaginal 09/13/2014  . Primary osteoarthritis of both knees 09/13/2014  . Pelvic muscle wasting 09/13/2014  . Cleft palate 09/13/2014  . 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/13/2014  . Epicondylitis elbow, medial 03/18/2014  . 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 Gastroesophageal reflux disease with hiatal  hernia 06/28/2009  . Vitamin D deficiency 04/05/2009  . Familial hyperlipidemia, high LDL 02/10/2009  . History of Graves' disease 02/10/2009    Past Surgical History:  Procedure Laterality Date  . ABDOMINAL HYSTERECTOMY    . bladder tact    . ROTATOR CUFF REPAIR      Family History  Problem Relation Age of Onset  . Hyperlipidemia Mother   . Hypertension Mother   . Diabetes Mother   . Cancer Father     Social History   Social History  . Marital status: Married    Spouse name: N/A  . Number of children: N/A  . Years of education: N/A   Occupational History  . Not on file.   Social History Main Topics  . Smoking status: Never Smoker  . Smokeless tobacco: Never Used  . Alcohol use No  . Drug use: No  . Sexual activity: Yes    Partners: Male   Other Topics Concern  . Not on file   Social History Narrative  . No narrative on file     Current Outpatient Prescriptions:  .  co-enzyme Q-10 50 MG capsule, Take 200 mg by mouth daily. , Disp: , Rfl:  .  meloxicam (MOBIC) 15 MG tablet, TAKE 1 TABLET (15 MG TOTAL) BY MOUTH DAILY., Disp: 90 tablet, Rfl: 0 .  mometasone (NASONEX) 50 MCG/ACT nasal spray, Place 2 sprays into the nose daily., Disp: 17 g, Rfl: 6 .  Multiple  Vitamin (MULTIVITAMIN) capsule, Take 1 capsule by mouth daily. , Disp: , Rfl:  .  Omega-3 1000 MG CAPS, Take 1,200 mg by mouth daily. , Disp: , Rfl:  .  polyethylene glycol (MIRALAX / GLYCOLAX) packet, Take 1 packet by mouth daily., Disp: , Rfl:  .  rosuvastatin (CRESTOR) 10 MG tablet, TAKE 1 TABLET BY MOUTH EVERY DAY FOR CHOLESTEROL, Disp: 90 tablet, Rfl: 1 .  tiZANidine (ZANAFLEX) 4 MG tablet, Take 1 tablet (4 mg total) by mouth 3 (three) times daily., Disp: 90 tablet, Rfl: 1 .  vitamin E 1000 UNIT capsule, Take 400 Units by mouth daily. , Disp: , Rfl:   Allergies  Allergen Reactions  . Aspirin Rash  . Sulfa Antibiotics Rash  . Penicillins Nausea And Vomiting  . Latex Rash    Other Reaction: WELTS      ROS  Constitutional: Negative for fever or weight change.  Respiratory: Negative for cough and shortness of breath.   Cardiovascular: Negative for chest pain or palpitations.  Gastrointestinal: Negative for abdominal pain, no bowel changes.  Musculoskeletal: Negative for gait problem or joint swelling.  Skin: Negative for rash.  Neurological: Negative for dizziness or headache.  No other specific complaints in a complete review of systems (except as listed in HPI above).  Objective  Vitals:   11/25/15 0810  BP: 120/74  Pulse: 89  Resp: 18  Temp: 98.3 F (36.8 C)  TempSrc: Oral  SpO2: 98%  Weight: 140 lb 9 oz (63.8 kg)  Height: 5' (1.524 m)    Body mass index is 27.45 kg/m.  Physical Exam  Constitutional: Patient appears well-developed and well-nourished. Obese  No distress.  HEENT: head atraumatic, normocephalic, pupils equal and reactive to light, neck supple, throat within normal limits Cardiovascular: Normal rate, regular rhythm and normal heart sounds.  No murmur heard. No BLE edema. Pulmonary/Chest: Effort normal and breath sounds normal. No respiratory distress. Abdominal: Soft.  There is no tenderness. Psychiatric: Patient has a normal mood and affect. behavior is normal. Judgment and thought content normal. Muscular Skeletal: normal back exam, negative straight leg raise   Recent Results (from the past 2160 hour(s))  HM COLONOSCOPY     Status: None   Collection Time: 09/28/15 12:00 AM  Result Value Ref Range   [facility] Colonoscopy Patient Reported See Report (in chart), Patient Reported    Comment: Normal-[facility]     [facility]9: [facility] 11/25/2015 10/25/2015 07/22/2015 04/22/2015 09/13/2014  Decreased Interest 0 0 0 0 0  Down, Depressed, Hopeless 0 0 0 0 0  PHQ - 2 Score 0 0 0 0 0    Fall Risk: Fall Risk  11/25/2015 10/25/2015 07/22/2015 04/22/2015 09/13/2014  Falls in the past year? No No No No No     Functional  Status Survey: Is the patient deaf or have difficulty hearing?: No Does the patient have difficulty seeing, even when wearing glasses/contacts?: Yes Does the patient have difficulty concentrating, remembering, or making decisions?: No Does the patient have difficulty walking or climbing stairs?: No Does the patient have difficulty dressing or bathing?: No Does the patient have difficulty doing errands alone such as visiting a doctor's office or shopping?: No    Assessment & Plan  1. Familial hyperlipidemia, high LDL  - rosuvastatin (CRESTOR) 10 MG tablet; TAKE 1 TABLET BY MOUTH EVERY DAY FOR CHOLESTEROL  Dispense: 90 tablet; Refill: 1  2. Right knee pain  Keep follow up   3. Back pain, chronic  Discussed exercises, continue medications prn   4. Perennial allergic rhinitis  Continue nasal steroids   5. History of Graves' disease  Normal TSH

## 2015-11-26 ENCOUNTER — Other Ambulatory Visit: Payer: Self-pay | Admitting: Family Medicine

## 2015-11-26 DIAGNOSIS — E7849 Other hyperlipidemia: Secondary | ICD-10-CM

## 2015-12-07 ENCOUNTER — Telehealth: Payer: Self-pay | Admitting: Family Medicine

## 2015-12-07 ENCOUNTER — Other Ambulatory Visit: Payer: Self-pay | Admitting: Family Medicine

## 2015-12-07 DIAGNOSIS — R002 Palpitations: Secondary | ICD-10-CM

## 2015-12-07 NOTE — Telephone Encounter (Signed)
Ordered referral to cardiologist

## 2015-12-07 NOTE — Telephone Encounter (Signed)
Pt states she discussed with Dr Carlynn PurlSowles about her heart racing and she was advised that if it continues then she would get a referral to get a heart monitor. Please advise.

## 2016-01-06 ENCOUNTER — Ambulatory Visit (INDEPENDENT_AMBULATORY_CARE_PROVIDER_SITE_OTHER): Payer: BC Managed Care – PPO | Admitting: Family Medicine

## 2016-01-06 ENCOUNTER — Encounter: Payer: Self-pay | Admitting: Family Medicine

## 2016-01-06 VITALS — BP 136/84 | HR 103 | Temp 97.8°F | Resp 16 | Ht 64.0 in | Wt 137.8 lb

## 2016-01-06 DIAGNOSIS — Z8639 Personal history of other endocrine, nutritional and metabolic disease: Secondary | ICD-10-CM

## 2016-01-06 DIAGNOSIS — R0602 Shortness of breath: Secondary | ICD-10-CM

## 2016-01-06 DIAGNOSIS — I493 Ventricular premature depolarization: Secondary | ICD-10-CM | POA: Diagnosis not present

## 2016-01-06 DIAGNOSIS — R002 Palpitations: Secondary | ICD-10-CM

## 2016-01-06 LAB — CBC WITH DIFFERENTIAL/PLATELET
Basophils Absolute: 0 cells/uL (ref 0–200)
Basophils Relative: 0 %
Eosinophils Absolute: 160 cells/uL (ref 15–500)
Eosinophils Relative: 2 %
HEMATOCRIT: 42.9 % (ref 35.0–45.0)
Hemoglobin: 13.7 g/dL (ref 11.7–15.5)
LYMPHS PCT: 25 %
Lymphs Abs: 2000 cells/uL (ref 850–3900)
MCH: 29.2 pg (ref 27.0–33.0)
MCHC: 31.9 g/dL — AB (ref 32.0–36.0)
MCV: 91.5 fL (ref 80.0–100.0)
MONO ABS: 560 {cells}/uL (ref 200–950)
MONOS PCT: 7 %
MPV: 10.2 fL (ref 7.5–12.5)
NEUTROS PCT: 66 %
Neutro Abs: 5280 cells/uL (ref 1500–7800)
PLATELETS: 292 10*3/uL (ref 140–400)
RBC: 4.69 MIL/uL (ref 3.80–5.10)
RDW: 13.6 % (ref 11.0–15.0)
WBC: 8 10*3/uL (ref 3.8–10.8)

## 2016-01-06 MED ORDER — METOPROLOL SUCCINATE ER 25 MG PO TB24: 25.0000 mg | ORAL_TABLET | Freq: Every day | ORAL | 0 refills | Status: DC

## 2016-01-06 NOTE — Progress Notes (Signed)
Name: Mia Camacho   MRN: [id number]    DOB: 03-Nov-1955   Date:01/06/2016       Progress Note  Subjective  Chief Complaint  Chief Complaint  Patient presents with  . Palpitations    Onset-several weeks, patient states it is worst when laying down but will resolve temporary once sitting back up. She will get winded but has happened every day and wants to get checked out.     HPI  Palpitation: she states symptoms started over one month ago, but since TSH was normal and symptoms did not resolve she decided to come in for a follow up. She states occasionally she gest SOB, but no chest pain. Denies orthopnea. She states symptoms seems to be worse when laying on left lateral decubitus. 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 She denies diarrhea, lack of appetite. She has lost 5 lbs but states she has been working on it  Patient Active Problem List   Diagnosis Date Noted  . Palpitation 12/07/2015  . Acute pain of right knee 11/22/2015  . Perennial allergic rhinitis 09/13/2014  . Back pain, chronic 09/13/2014  . Diverticulosis 09/13/2014  . Bursitis, trochanteric 09/13/2014  . Herpes 09/13/2014  . Hiatal hernia 09/13/2014  . H/O: hysterectomy 09/13/2014  . Personal history of urinary calculi 09/13/2014  . Menopause 09/13/2014  . Hernia, rectovaginal 09/13/2014  . Primary osteoarthritis of both knees 09/13/2014  . Pelvic muscle wasting 09/13/2014  . Cleft palate 09/13/2014  . 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/13/2014  . Epicondylitis elbow, medial 03/18/2014  . 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 Gastroesophageal reflux disease with hiatal hernia 06/28/2009  . Vitamin D deficiency 04/05/2009  . Familial hyperlipidemia, high LDL 02/10/2009  . History of Graves' disease 02/10/2009    Past Surgical History:  Procedure Laterality Date  . ABDOMINAL HYSTERECTOMY    . bladder tact    . ROTATOR CUFF REPAIR      Family  History  Problem Relation Age of Onset  . Hyperlipidemia Mother   . Hypertension Mother   . Diabetes Mother   . Cancer Father     Social History   Social History  . Marital status: Married    Spouse name: N/A  . Number of children: N/A  . Years of education: N/A   Occupational History  . Not on file.   Social History Main Topics  . Smoking status: Never Smoker  . Smokeless tobacco: Never Used  . Alcohol use No  . Drug use: No  . Sexual activity: Yes    Partners: Male   Other Topics Concern  . Not on file   Social History Narrative  . No narrative on file     Current Outpatient Prescriptions:  .  cholecalciferol (VITAMIN D) 1000 units tablet, Take 1,000 Units by mouth daily., Disp: , Rfl:  .  co-enzyme Q-10 50 MG capsule, Take 200 mg by mouth daily. , Disp: , Rfl:  .  meloxicam (MOBIC) 15 MG tablet, TAKE 1 TABLET (15 MG TOTAL) BY MOUTH DAILY., Disp: 90 tablet, Rfl: 0 .  mometasone (NASONEX) 50 MCG/ACT nasal spray, Place 2 sprays into the nose daily., Disp: 17 g, Rfl: 6 .  Multiple Vitamin (MULTIVITAMIN) capsule, Take 1 capsule by mouth daily. , Disp: , Rfl:  .  Omega-3 1000 MG CAPS, Take 1,200 mg by mouth daily. , Disp: , Rfl:  .  polyethylene glycol (MIRALAX / GLYCOLAX) packet, Take 1  packet by mouth daily., Disp: , Rfl:  .  rosuvastatin (CRESTOR) 10 MG tablet, TAKE 1 TABLET BY MOUTH EVERY DAY FOR CHOLESTEROL, Disp: 90 tablet, Rfl: 1 .  tiZANidine (ZANAFLEX) 4 MG tablet, Take 1 tablet (4 mg total) by mouth 3 (three) times daily., Disp: 90 tablet, Rfl: 1 .  vitamin E 1000 UNIT capsule, Take 400 Units by mouth daily. , Disp: , Rfl:  .  metoprolol succinate (TOPROL-XL) 25 MG 24 hr tablet, Take 1 tablet (25 mg total) by mouth daily., Disp: 30 tablet, Rfl: 0  Allergies  Allergen Reactions  . Aspirin Rash  . Sulfa Antibiotics Rash  . Penicillins Nausea And Vomiting  . Latex Rash    Other Reaction: WELTS     ROS  Ten systems reviewed and is negative except as  mentioned in HPI   Objective  Vitals:   01/06/16 1229  BP: 136/84  Pulse: (!) 103  Resp: 16  Temp: 97.8 F (36.6 C)  TempSrc: Oral  SpO2: 96%  Weight: 137 lb 12.8 oz (62.5 kg)  Height: 5\' 4"  (1.626 m)    Body mass index is 23.65 kg/m.  Physical Exam  Constitutional: Patient appears well-developed and well-nourished. Obese  No distress.  HEENT: head atraumatic, normocephalic, pupils equal and reactive to light,  neck supple, throat within normal limits, normal thyroid exam Cardiovascular: Normal rate, regular rhythm but has some extra beats.  No murmur heard. No BLE edema. Pulmonary/Chest: Effort normal and breath sounds normal. No respiratory distress. Abdominal: Soft.  There is no tenderness. Psychiatric: Patient has a normal mood and affect. behavior is normal. Judgment and thought content normal.   PHQ2/9: [facility] 11/25/2015 10/25/2015 07/22/2015 04/22/2015 09/13/2014  Decreased Interest 0 0 0 0 0  Down, Depressed, Hopeless 0 0 0 0 0  PHQ - 2 Score 0 0 0 0 0     Fall Risk: Fall Risk  11/25/2015 10/25/2015 07/22/2015 04/22/2015 09/13/2014  Falls in the past year? No No No No No    Assessment & Plan  1. History of Graves' disease  - Thyroid Panel With TSH - metoprolol succinate (TOPROL-XL) 25 MG 24 hr tablet; Take 1 tablet (25 mg total) by mouth daily.  Dispense: 30 tablet; Refill: 0  2. Palpitation  - Thyroid Panel With TSH - CBC with Differential/Platelet - Brain natriuretic peptide - metoprolol succinate (TOPROL-XL) 25 MG 24 hr tablet; Take 1 tablet (25 mg total) by mouth daily.  Dispense: 30 tablet; Refill: 0 We will refer her to Endo if TSH abnormal or Cardiologist if normal thyroid panel for a holter monitor. She has PVC's on her EKG   3. Shortness of breath  - CBC with Differential/Platelet   4. PVC (premature ventricular contraction)  Start beta-blocker

## 2016-01-07 ENCOUNTER — Other Ambulatory Visit: Payer: Self-pay | Admitting: Family Medicine

## 2016-01-07 DIAGNOSIS — I493 Ventricular premature depolarization: Secondary | ICD-10-CM

## 2016-01-07 DIAGNOSIS — R002 Palpitations: Secondary | ICD-10-CM

## 2016-01-07 LAB — THYROID PANEL WITH TSH
FREE THYROXINE INDEX: 2.3 (ref 1.4–3.8)
T3 UPTAKE: 34 % (ref 22–35)
T4 TOTAL: 6.8 ug/dL (ref 4.5–12.0)
TSH: 1.12 mIU/L

## 2016-01-07 LAB — BRAIN NATRIURETIC PEPTIDE: Brain Natriuretic Peptide: 14.8 pg/mL (ref <100)

## 2016-01-10 ENCOUNTER — Encounter: Payer: Self-pay | Admitting: Family Medicine

## 2016-02-06 ENCOUNTER — Other Ambulatory Visit: Payer: Self-pay | Admitting: Family Medicine

## 2016-02-06 DIAGNOSIS — R002 Palpitations: Secondary | ICD-10-CM

## 2016-02-06 DIAGNOSIS — Z8639 Personal history of other endocrine, nutritional and metabolic disease: Secondary | ICD-10-CM

## 2016-02-06 NOTE — Telephone Encounter (Signed)
Patient requesting refill of Metoprolol to [facility].  

## 2016-03-28 ENCOUNTER — Other Ambulatory Visit: Payer: Self-pay | Admitting: Family Medicine

## 2016-03-28 NOTE — Telephone Encounter (Signed)
Patient requesting refill of Meloxicam to [facility].  

## 2016-05-25 ENCOUNTER — Encounter: Payer: Self-pay | Admitting: Family Medicine

## 2016-05-25 ENCOUNTER — Ambulatory Visit (INDEPENDENT_AMBULATORY_CARE_PROVIDER_SITE_OTHER): Payer: BC Managed Care – PPO | Admitting: Family Medicine

## 2016-05-25 VITALS — BP 116/64 | HR 90 | Temp 98.1°F | Resp 16 | Ht 64.0 in | Wt 144.0 lb

## 2016-05-25 DIAGNOSIS — Z79899 Other long term (current) drug therapy: Secondary | ICD-10-CM | POA: Diagnosis not present

## 2016-05-25 DIAGNOSIS — R198 Other specified symptoms and signs involving the digestive system and abdomen: Secondary | ICD-10-CM | POA: Diagnosis not present

## 2016-05-25 DIAGNOSIS — E784 Other hyperlipidemia: Secondary | ICD-10-CM | POA: Diagnosis not present

## 2016-05-25 DIAGNOSIS — Z131 Encounter for screening for diabetes mellitus: Secondary | ICD-10-CM | POA: Diagnosis not present

## 2016-05-25 DIAGNOSIS — J3089 Other allergic rhinitis: Secondary | ICD-10-CM | POA: Diagnosis not present

## 2016-05-25 DIAGNOSIS — Z8639 Personal history of other endocrine, nutritional and metabolic disease: Secondary | ICD-10-CM

## 2016-05-25 DIAGNOSIS — G8929 Other chronic pain: Secondary | ICD-10-CM | POA: Diagnosis not present

## 2016-05-25 DIAGNOSIS — R002 Palpitations: Secondary | ICD-10-CM

## 2016-05-25 DIAGNOSIS — E7849 Other hyperlipidemia: Secondary | ICD-10-CM

## 2016-05-25 DIAGNOSIS — M545 Low back pain: Secondary | ICD-10-CM

## 2016-05-25 MED ORDER — ROSUVASTATIN CALCIUM 10 MG PO TABS: ORAL_TABLET | ORAL | 1 refills | Status: DC

## 2016-05-25 MED ORDER — MOMETASONE FUROATE 50 MCG/ACT NA SUSP: 2.0000 | Freq: Every day | NASAL | 6 refills | Status: DC

## 2016-05-25 NOTE — Progress Notes (Signed)
Name: Mia Camacho   MRN: [id number]    DOB: 1955/09/15   Date:05/25/2016       Progress Note  Subjective  Chief Complaint  Chief Complaint  Patient presents with  . Gastroesophageal Reflux  . Hyperlipidemia    no issues  . Palpitations    pt saw cardiology and was told she was ok and pt stopped taking metoprolol                                   . hx of graves disease    HPI  Hyperlipidemia: she has been tolerating Crestor 10mg , currently taking one pill, and Co Q 10, she denies myalgias at this time. LDL down from 300's to 132. She is doing well. We will recheck level   History of Graves: she has been off Methimazole for years now, last labs within normal limits, no hair loss, diarrhea, palpitation has resolved. We will recheck level   AR: symptoms are usually are pruritus and congestion. Using steroid nasally prn only and seems to control symptoms. Symptoms are under control at this time. Using medication about once a week.   Chronic Back pain: she has a long history of low back pain, she states pain is daily, but not very intense, average of 2/10, it can go up to 5/10 - takes Meloxicam and Tizanidine prn. Taking medication prn. She states tingling on her toes has resolved. She has gone to PT and still does PT exercise at home  Right knee pain: seen by Ortho at [facility] and had normal x-rays, knee pain has resolved  Palpitation: she states symptoms started end of 2017. She was placed on Metoprolol was seen by Dr. Lady Gary and negative stress test and labs. She is doing well, off medication, likely secondary to stress.    Patient Active Problem List   Diagnosis Date Noted  . Palpitation 12/07/2015  . Acute pain of right knee 11/22/2015  . Perennial allergic rhinitis 09/13/2014  . Back pain, chronic 09/13/2014  . Diverticulosis 09/13/2014  . Bursitis, trochanteric 09/13/2014  . Herpes 09/13/2014  . Hiatal hernia 09/13/2014  . H/O: hysterectomy 09/13/2014  . Personal history of  urinary calculi 09/13/2014  . Menopause 09/13/2014  . Hernia, rectovaginal 09/13/2014  . Primary osteoarthritis of both knees 09/13/2014  . Pelvic muscle wasting 09/13/2014  . Cleft palate 09/13/2014  . 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/13/2014  . Epicondylitis elbow, medial 03/18/2014  . 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 Gastroesophageal reflux disease with hiatal hernia 06/28/2009  . Vitamin D deficiency 04/05/2009  . Familial hyperlipidemia, high LDL 02/10/2009  . History of Graves' disease 02/10/2009    Past Surgical History:  Procedure Laterality Date  . ABDOMINAL HYSTERECTOMY    . bladder tact    . ROTATOR CUFF REPAIR      Family History  Problem Relation Age of Onset  . Hyperlipidemia Mother   . Hypertension Mother   . Diabetes Mother   . Cancer Father     Social History   Social History  . Marital status: Married    Spouse name: N/A  . Number of children: N/A  . Years of education: N/A   Occupational History  . Not on file.   Social History Main Topics  . Smoking status: Never Smoker  . Smokeless tobacco: Never Used  . Alcohol use No  . Drug use: No  . Sexual activity: Yes  Partners: Male   Other Topics Concern  . Not on file   Social History Narrative  . No narrative on file     Current Outpatient Prescriptions:  .  cholecalciferol (VITAMIN D) 1000 units tablet, Take 1,000 Units by mouth daily., Disp: , Rfl:  .  co-enzyme Q-10 50 MG capsule, Take 200 mg by mouth daily. , Disp: , Rfl:  .  meloxicam (MOBIC) 15 MG tablet, TAKE 1 TABLET (15 MG TOTAL) BY MOUTH DAILY., Disp: 90 tablet, Rfl: 0 .  mometasone (NASONEX) 50 MCG/ACT nasal spray, Place 2 sprays into the nose daily., Disp: 17 g, Rfl: 6 .  Multiple Vitamin (MULTIVITAMIN) capsule, Take 1 capsule by mouth daily. , Disp: , Rfl:  .  Omega-3 1000 MG CAPS, Take 1,200 mg by mouth daily. , Disp: , Rfl:  .  polyethylene glycol (MIRALAX / GLYCOLAX) packet, Take 1 packet by mouth daily., Disp: , Rfl:  .   rosuvastatin (CRESTOR) 10 MG tablet, TAKE 1 TABLET BY MOUTH EVERY DAY FOR CHOLESTEROL, Disp: 90 tablet, Rfl: 1 .  tiZANidine (ZANAFLEX) 4 MG tablet, Take 1 tablet (4 mg total) by mouth 3 (three) times daily., Disp: 90 tablet, Rfl: 1 .  vitamin E 1000 UNIT capsule, Take 400 Units by mouth daily. , Disp: , Rfl:   Allergies  Allergen Reactions  . Aspirin Rash  . Sulfa Antibiotics Rash  . Penicillins Nausea And Vomiting  . Latex Rash    Other Reaction: WELTS     ROS  Constitutional: Negative for fever or significant weight change.  Respiratory: Negative for cough and shortness of breath.   Cardiovascular: Negative for chest pain or palpitations.  Gastrointestinal: Negative for abdominal pain, no bowel changes.  Musculoskeletal: Negative for gait problem or joint swelling.  Skin: Negative for rash.  Neurological: Negative for dizziness or headache.  No other specific complaints in a complete review of systems (except as listed in HPI above).  Objective  Vitals:   05/25/16 0824  BP: 116/64  Pulse: 90  Resp: 16  Temp: 98.1 F (36.7 C)  SpO2: 95%  Weight: 144 lb (65.3 kg)  Height: 5\' 4"  (1.626 m)    Body mass index is 24.72 kg/m.  Physical Exam  Constitutional: Patient appears well-developed and well-nourished. Obese  No distress.  HEENT: head atraumatic, normocephalic, pupils equal and reactive to light,  neck supple, throat within normal limits Cardiovascular: Normal rate, regular rhythm and normal heart sounds.  No murmur heard. No BLE edema. Pulmonary/Chest: Effort normal and breath sounds normal. No respiratory distress. Abdominal: Soft.  There is no tenderness. Psychiatric: Patient has a normal mood and affect. behavior is normal. Judgment and thought content normal.  PHQ2/9: Depression screen [facility] 2/9 05/25/2016 11/25/2015 10/25/2015 07/22/2015 04/22/2015  Decreased Interest 0 0 0 0 0  Down, Depressed, Hopeless 0 0 0 0 0  PHQ - 2 Score 0 0 0 0 0    Fall Risk: Fall  Risk  05/25/2016 11/25/2015 10/25/2015 07/22/2015 04/22/2015  Falls in the past year? No No No No No    Functional Status Survey: Is the patient deaf or have difficulty hearing?: No Does the patient have difficulty seeing, even when wearing glasses/contacts?: No Does the patient have difficulty concentrating, remembering, or making decisions?: No Does the patient have difficulty walking or climbing stairs?: No Does the patient have difficulty dressing or bathing?: No Does the patient have difficulty doing errands alone such as visiting a doctor's office or shopping?: No  Assessment & Plan  1. History of Graves' disease  - Thyroid Panel With TSH  2. Palpitation  Resolved.   3. Familial hyperlipidemia, high LDL  - rosuvastatin (CRESTOR) 10 MG tablet; TAKE 1 TABLET BY MOUTH EVERY DAY FOR CHOLESTEROL  Dispense: 90 tablet; Refill: 1 - Lipid panel  4. Perennial allergic rhinitis  - mometasone (NASONEX) 50 MCG/ACT nasal spray; Place 2 sprays into the nose daily.  Dispense: 17 g; Refill: 6  5. Chronic bilateral low back pain without sciatica   6. Increased abdominal girth  - Hemoglobin A1c - Insulin, fasting  7. Long-term use of high-risk medication  - COMPLETE METABOLIC PANEL WITH GFR  8. Diabetes mellitus screening  - Hemoglobin A1c

## 2016-05-26 LAB — COMPLETE METABOLIC PANEL WITH GFR
ALK PHOS: 77 U/L (ref 33–130)
ALT: 20 U/L (ref 6–29)
AST: 22 U/L (ref 10–35)
Albumin: 4.4 g/dL (ref 3.6–5.1)
BILIRUBIN TOTAL: 0.3 mg/dL (ref 0.2–1.2)
BUN: 15 mg/dL (ref 7–25)
CO2: 28 mmol/L (ref 20–31)
Calcium: 9.5 mg/dL (ref 8.6–10.4)
Chloride: 103 mmol/L (ref 98–110)
Creat: 0.7 mg/dL (ref 0.50–0.99)
Glucose, Bld: 93 mg/dL (ref 65–99)
Potassium: 4.4 mmol/L (ref 3.5–5.3)
Sodium: 140 mmol/L (ref 135–146)
TOTAL PROTEIN: 6.8 g/dL (ref 6.1–8.1)

## 2016-05-26 LAB — LIPID PANEL
Cholesterol: 247 mg/dL — ABNORMAL HIGH (ref <200)
HDL: 85 mg/dL (ref >50)
LDL CALC: 150 mg/dL — AB (ref <100)
TRIGLYCERIDES: 62 mg/dL (ref <150)
Total CHOL/HDL Ratio: 2.9 Ratio (ref <5.0)
VLDL: 12 mg/dL (ref <30)

## 2016-05-26 LAB — THYROID PANEL WITH TSH
FREE THYROXINE INDEX: 2.7 (ref 1.4–3.8)
T3 UPTAKE: 35 % (ref 22–35)
T4, Total: 7.6 ug/dL (ref 4.5–12.0)
TSH: 1.65 mIU/L

## 2016-05-26 LAB — HEMOGLOBIN A1C
HEMOGLOBIN A1C: 5.2 % (ref <5.7)
Mean Plasma Glucose: 103 mg/dL

## 2016-05-28 LAB — INSULIN, FASTING: INSULIN FASTING, SERUM: 11.4 u[IU]/mL (ref 2.0–19.6)

## 2016-07-09 ENCOUNTER — Other Ambulatory Visit: Payer: Self-pay | Admitting: Family Medicine

## 2016-07-09 NOTE — Telephone Encounter (Signed)
Patient requesting refill of Meloxicam to [facility].  

## 2016-12-09 ENCOUNTER — Other Ambulatory Visit: Payer: Self-pay | Admitting: Family Medicine

## 2016-12-09 DIAGNOSIS — E7849 Other hyperlipidemia: Secondary | ICD-10-CM

## 2016-12-14 ENCOUNTER — Encounter: Payer: BC Managed Care – PPO | Admitting: Family Medicine

## 2017-01-21 ENCOUNTER — Encounter: Payer: BC Managed Care – PPO | Admitting: Family Medicine

## 2017-04-22 ENCOUNTER — Ambulatory Visit (INDEPENDENT_AMBULATORY_CARE_PROVIDER_SITE_OTHER): Payer: BC Managed Care – PPO | Admitting: Family Medicine

## 2017-04-22 ENCOUNTER — Encounter: Payer: Self-pay | Admitting: Family Medicine

## 2017-04-22 VITALS — BP 112/70 | HR 96 | Temp 98.5°F | Resp 16 | Ht 64.0 in | Wt 142.2 lb

## 2017-04-22 DIAGNOSIS — Z8639 Personal history of other endocrine, nutritional and metabolic disease: Secondary | ICD-10-CM

## 2017-04-22 DIAGNOSIS — Z79899 Other long term (current) drug therapy: Secondary | ICD-10-CM

## 2017-04-22 DIAGNOSIS — M545 Low back pain, unspecified: Secondary | ICD-10-CM

## 2017-04-22 DIAGNOSIS — Z01419 Encounter for gynecological examination (general) (routine) without abnormal findings: Secondary | ICD-10-CM

## 2017-04-22 DIAGNOSIS — Z23 Encounter for immunization: Secondary | ICD-10-CM | POA: Diagnosis not present

## 2017-04-22 DIAGNOSIS — G8929 Other chronic pain: Secondary | ICD-10-CM | POA: Diagnosis not present

## 2017-04-22 DIAGNOSIS — Z01411 Encounter for gynecological examination (general) (routine) with abnormal findings: Secondary | ICD-10-CM | POA: Diagnosis not present

## 2017-04-22 DIAGNOSIS — Z131 Encounter for screening for diabetes mellitus: Secondary | ICD-10-CM | POA: Diagnosis not present

## 2017-04-22 DIAGNOSIS — J3089 Other allergic rhinitis: Secondary | ICD-10-CM | POA: Diagnosis not present

## 2017-04-22 DIAGNOSIS — E7849 Other hyperlipidemia: Secondary | ICD-10-CM | POA: Diagnosis not present

## 2017-04-22 DIAGNOSIS — Z9889 Other specified postprocedural states: Secondary | ICD-10-CM

## 2017-04-22 DIAGNOSIS — M25512 Pain in left shoulder: Secondary | ICD-10-CM

## 2017-04-22 DIAGNOSIS — Z1239 Encounter for other screening for malignant neoplasm of breast: Secondary | ICD-10-CM

## 2017-04-22 DIAGNOSIS — E2839 Other primary ovarian failure: Secondary | ICD-10-CM | POA: Diagnosis not present

## 2017-04-22 MED ORDER — MELOXICAM 15 MG PO TABS: ORAL_TABLET | ORAL | 0 refills | Status: DC

## 2017-04-22 MED ORDER — TIZANIDINE HCL 4 MG PO TABS: 4.0000 mg | ORAL_TABLET | Freq: Three times a day (TID) | ORAL | 1 refills | Status: DC

## 2017-04-22 MED ORDER — MOMETASONE FUROATE 50 MCG/ACT NA SUSP: 2.0000 | Freq: Every day | NASAL | 6 refills | Status: DC

## 2017-04-22 MED ORDER — ROSUVASTATIN CALCIUM 10 MG PO TABS: ORAL_TABLET | ORAL | 1 refills | Status: DC

## 2017-04-22 NOTE — Progress Notes (Signed)
Name: Mia Camacho   MRN: [id number]    DOB: Dec 26, 1955   Date:04/22/2017       Progress Note  Subjective  Chief Complaint  Chief Complaint  Patient presents with  . Annual Exam  . Follow-up    HPI   Patient presents for annual CPE and follow up  Hyperlipidemia: she has been tolerating Crestor 68m, currently taking one pill, and Co Q 10, she denies myalgias at this time. LDL down from 300's to 150. She is taking medication daily, we tried higher dose but she could not tolerate - caused leg cramps.   History of Graves: she has been off Methimazole for years now, last labs within normal limits, no hair loss, diarrhea, palpitation has resolved. We will recheck level   AR: symptoms are usually are pruritus worse on her ear and congestion. Worse during spring, uses medication prn  Chronic Back pain: she has a long history of low back pain, she states pain is daily, but not very intense, no pain at this time, and goes up to 2/10  takes Meloxicam and Tizanidine prn. Taking medication prn. She states tingling on her toes has resolved. She has gone to PT and still does PT exercise at home, and it helped  Left shoulder pain: she has a history of rotator cuff repair on both shoulders, and a couple of weeks ago she used left arm to place a comfort over her bed and developed acute pain on lateral aspect of left shoulder an has painful since, intermittently. She has good rom of shoulder, but abduction causes some pain   Palpitation: she states symptoms started end of 2017. She was placed on Metoprolol was seen by Dr. FUbaldo Glassingand negative stress test and labs. She is doing well, off medication, likely secondary to stress.     USPSTF grade A and B recommendations  Depression:  [facility] 04/22/2017 05/25/2016 11/25/2015 10/25/2015 07/22/2015  Decreased Interest 0 0 0 0 0  Down, Depressed, Hopeless 0 0 0 0 0  PHQ - 2 Score 0 0 0 0 0   Hypertension: BP Readings from Last 3  Encounters:  04/22/17 112/70  05/25/16 116/64  01/06/16 136/84   Obesity: Wt Readings from Last 3 Encounters:  04/22/17 142 lb 3.2 oz (64.5 kg)  05/25/16 144 lb (65.3 kg)  01/06/16 137 lb 12.8 oz (62.5 kg)   BMI Readings from Last 3 Encounters:  04/22/17 24.41 kg/m  05/25/16 24.72 kg/m  01/06/16 23.65 kg/m     HIV, hep B, hep C: not interested  STD testing and prevention (chl/gon/syphilis): not interested Intimate partner violence:negative  Sexual History/Pain during Intercourse: sexually active, no pain, no bleeding Menstrual History/LMP/Abnormal Bleeding: s/p hysterectomy at age [age]for endometriosis  Incontinence Symptoms: she has rectocele, seen by Urologist in the past  Advanced Care Planning: A voluntary discussion about advance care planning including the explanation and discussion of advance directives.  Discussed health care proxy and Living will, and the patient was able to identify a health care proxy as husband .  Patient does have a living will at present time. If patient does have living will, I have requested they bring this to the clinic to be scanned in to their chart.  Breast cancer: 11/11/2015 BRCA gene screening: not indicated  Cervical cancer screening: 2016  Osteoporosis:  She will schedule it Fall prevention/vitamin D: discussed  Lipids:  Lab Results  Component Value Date   CHOL 247 (H) 05/25/2016   CHOL  263 (H) 01/03/2015   Lab Results  Component Value Date   HDL 85 05/25/2016   HDL 72 01/03/2015   Lab Results  Component Value Date   LDLCALC 150 (H) 05/25/2016   LDLCALC 160 (H) 01/03/2015   Lab Results  Component Value Date   TRIG 62 05/25/2016   TRIG 153 (H) 01/03/2015   Lab Results  Component Value Date   CHOLHDL 2.9 05/25/2016   CHOLHDL 3.7 01/03/2015   No results found for: LDLDIRECT  Glucose:  Glucose  Date Value Ref Range Status  01/03/2015 90 65 - 99 mg/dL Final   Glucose, Bld  Date Value Ref Range Status  05/25/2016  93 65 - 99 mg/dL Final    Skin cancer: no changes Colorectal cancer: 2017 Lung cancer:  Low Dose CT Chest recommended if Age 71-80 years, 30 pack-year currently smoking OR have quit w/in 15years. Patient does not qualify.   Aspirin: allergic  ECG: 2017   Patient Active Problem List   Diagnosis Date Noted  . Palpitation 12/07/2015  . Acute pain of right knee 11/22/2015  . Perennial allergic rhinitis 09/13/2014  . Back pain, chronic 09/13/2014  . Diverticulosis 09/13/2014  . Bursitis, trochanteric 09/13/2014  . Herpes 09/13/2014  . Hiatal hernia 09/13/2014  . H/O: hysterectomy 09/13/2014  . Personal history of urinary calculi 09/13/2014  . Menopause 09/13/2014  . Hernia, rectovaginal 09/13/2014  . Primary osteoarthritis of both knees 09/13/2014  . Pelvic muscle wasting 09/13/2014  . Cleft palate 09/13/2014  . 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/13/2014  . Epicondylitis elbow, medial 03/18/2014  . 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 Gastroesophageal reflux disease with hiatal hernia 06/28/2009  . Vitamin D deficiency 04/05/2009  . Familial hyperlipidemia, high LDL 02/10/2009  . History of Graves' disease 02/10/2009    Past Surgical History:  Procedure Laterality Date  . ABDOMINAL HYSTERECTOMY    . bladder tact    . ROTATOR CUFF REPAIR      Family History  Problem Relation Age of Onset  . Hyperlipidemia Mother   . Hypertension Mother   . Diabetes Mother   . Cancer Father     Social History   Socioeconomic History  . Marital status: Married    Spouse name: Darrell   . Number of children: 3  . Years of education: Not on file  . Highest education level: Some college, no degree  Social Needs  . Financial resource strain: Not hard at all  . Food insecurity - worry: Never true  . Food insecurity - inability: Never true  . Transportation needs - medical: No  . Transportation needs - non-medical: No  Occupational History  . Not on file  Tobacco Use  . Smoking status:  Never Smoker  . Smokeless tobacco: Never Used  Substance and Sexual Activity  . Alcohol use: No    Alcohol/week: 0.0 oz  . Drug use: No  . Sexual activity: Yes    Partners: Male  Other Topics Concern  . Not on file  Social History Narrative  . Not on file     Current Outpatient Medications:  .  cholecalciferol (VITAMIN D) 1000 units tablet, Take 1,000 Units by mouth daily., Disp: , Rfl:  .  co-enzyme Q-10 50 MG capsule, Take 200 mg by mouth daily. , Disp: , Rfl:  .  meloxicam (MOBIC) 15 MG tablet, TAKE 1 TABLET (15 MG TOTAL) BY MOUTH DAILY., Disp: 90 tablet, Rfl: 0 .  mometasone (NASONEX) 50 MCG/ACT nasal spray, Place 2 sprays  into the nose daily., Disp: 17 g, Rfl: 6 .  Multiple Vitamin (MULTIVITAMIN) capsule, Take 1 capsule by mouth daily. , Disp: , Rfl:  .  Omega-3 1000 MG CAPS, Take 1,200 mg by mouth daily. , Disp: , Rfl:  .  polyethylene glycol (MIRALAX / GLYCOLAX) packet, Take 1 packet by mouth daily., Disp: , Rfl:  .  rosuvastatin (CRESTOR) 10 MG tablet, TAKE 1 TABLET BY MOUTH EVERY DAY FOR CHOLESTEROL, Disp: 90 tablet, Rfl: 1 .  tiZANidine (ZANAFLEX) 4 MG tablet, Take 1 tablet (4 mg total) by mouth 3 (three) times daily., Disp: 90 tablet, Rfl: 1 .  vitamin E 1000 UNIT capsule, Take 400 Units by mouth daily. , Disp: , Rfl:   Allergies  Allergen Reactions  . Aspirin Rash  . Sulfa Antibiotics Rash  . Penicillins Nausea And Vomiting  . Latex Rash    Other Reaction: WELTS     ROS   Constitutional: Negative for fever or weight change.  Respiratory: Negative for cough and shortness of breath.   Cardiovascular: Negative for chest pain or palpitations.  Gastrointestinal: Negative for abdominal pain, no bowel changes.  Musculoskeletal: Negative for gait problem or joint swelling.  Skin: Negative for rash.  Neurological: Negative for dizziness or headache.  No other specific complaints in a complete review of systems (except as listed in HPI  above).   Objective  Vitals:   04/22/17 1400  BP: 112/70  Pulse: 96  Resp: 16  Temp: 98.5 F (36.9 C)  TempSrc: Oral  SpO2: 95%  Weight: 142 lb 3.2 oz (64.5 kg)  Height: 5' 4"  (1.626 m)    Body mass index is 24.41 kg/m.  Physical Exam  Constitutional: Patient appears well-developed and well-nourished. No distress.  HENT: Head: Normocephalic and atraumatic. Ears: B TMs shows scar tissue, no erythema or effusion; Nose: Nose normal. Mouth/Throat: Oropharynx is clear and moist. No oropharyngeal exudate. She has a history of clef palate surgery, some scarring noticed Eyes: Conjunctivae and EOM are normal. Pupils are equal, round, and reactive to light. No scleral icterus.  Neck: Normal range of motion. Neck supple. No JVD present. No thyromegaly present.  Cardiovascular: Normal rate, regular rhythm and normal heart sounds.  No murmur heard. No BLE edema. Pulmonary/Chest: Effort normal and breath sounds normal. No respiratory distress. Abdominal: Soft. Bowel sounds are normal, no distension. There is no tenderness. no masses Breast: no lumps or masses, no nipple discharge or rashes FEMALE GENITALIA:  External genitalia normal External urethra normal Vaginal walls showed large rectocele Bimanual exam normal  RECTAL: not done Musculoskeletal: decrease abduction of left shoulder, no joint effusions. No gross deformities Neurological: he is alert and oriented to person, place, and time. No cranial nerve deficit. Coordination, balance, strength, speech and gait are normal.  Skin: Skin is warm and dry. No rash noted. No erythema.  Psychiatric: Patient has a normal mood and affect. behavior is normal. Judgment and thought content normal.   PHQ2/9: Depression screen [facility] 2/9 04/22/2017 05/25/2016 11/25/2015 10/25/2015 07/22/2015  Decreased Interest 0 0 0 0 0  Down, Depressed, Hopeless 0 0 0 0 0  PHQ - 2 Score 0 0 0 0 0     Fall Risk: Fall Risk  04/22/2017 05/25/2016 11/25/2015 10/25/2015  07/22/2015  Falls in the past year? Yes No No No No  Number falls in past yr: 1 - - - -  Injury with Fall? Yes - - - -  Risk for fall due to : History of fall(s) - - - -  Follow up Education provided - - - -     Functional Status Survey: Is the patient deaf or have difficulty hearing?: No Does the patient have difficulty seeing, even when wearing glasses/contacts?: No Does the patient have difficulty concentrating, remembering, or making decisions?: No Does the patient have difficulty walking or climbing stairs?: No Does the patient have difficulty dressing or bathing?: No Does the patient have difficulty doing errands alone such as visiting a doctor's office or shopping?: No  Assessment & Plan  1. Well woman exam  Discussed importance of 150 minutes of physical activity weekly, eat two servings of fish weekly, eat one serving of tree nuts ( cashews, pistachios, pecans, almonds.Marland Kitchen) every other day, eat 6 servings of fruit/vegetables daily and drink plenty of water and avoid sweet beverages.   2. Breast cancer screening  - MM Digital Screening; Future  3. Long-term use of high-risk medication  - Comprehensive metabolic panel - CBC with Differential/Platelet  4. Familial hyperlipidemia, high LDL  - rosuvastatin (CRESTOR) 10 MG tablet; TAKE 1 TABLET BY MOUTH EVERY DAY FOR CHOLESTEROL  Dispense: 90 tablet; Refill: 1 - Lipid panel  5. Chronic bilateral low back pain without sciatica  - meloxicam (MOBIC) 15 MG tablet; TAKE 1 TABLET (15 MG TOTAL) BY MOUTH DAILY.  Dispense: 90 tablet; Refill: 0 - tiZANidine (ZANAFLEX) 4 MG tablet; Take 1 tablet (4 mg total) by mouth 3 (three) times daily.  Dispense: 90 tablet; Refill: 1  6. Diabetes mellitus screening  - Hemoglobin A1c  7. History of Graves' disease  - TSH  8. Acute pain of left shoulder  - meloxicam (MOBIC) 15 MG tablet; TAKE 1 TABLET (15 MG TOTAL) BY MOUTH DAILY.  Dispense: 90 tablet; Refill: 0  9. History of rotator  cuff surgery  She will call back for referral to ortho if no improvement of symptoms   10. Perennial allergic rhinitis  - mometasone (NASONEX) 50 MCG/ACT nasal spray; Place 2 sprays into the nose daily.  Dispense: 17 g; Refill: 6  11. Back pain, chronic  - tiZANidine (ZANAFLEX) 4 MG tablet; Take 1 tablet (4 mg total) by mouth 3 (three) times daily.  Dispense: 90 tablet; Refill: 1  12. Needs flu shot  - Flu Vaccine QUAD 36+ mos IM  13. Need for shingles vaccine  She will go to local pharmacy   14. Ovarian failure  - DG Bone Density; Future

## 2017-04-22 NOTE — Patient Instructions (Signed)
Preventive Care 40-64 Years, Female Preventive care refers to lifestyle choices and visits with your health care provider that can promote health and wellness. What does preventive care include?  A yearly physical exam. This is also called an annual well check.  Dental exams once or twice a year.  Routine eye exams. Ask your health care provider how often you should have your eyes checked.  Personal lifestyle choices, including: ? Daily care of your teeth and gums. ? Regular physical activity. ? Eating a healthy diet. ? Avoiding tobacco and drug use. ? Limiting alcohol use. ? Practicing safe sex. ? Taking low-dose aspirin daily starting at age 58. ? Taking vitamin and mineral supplements as recommended by your health care provider. What happens during an annual well check? The services and screenings done by your health care provider during your annual well check will depend on your age, overall health, lifestyle risk factors, and family history of disease. Counseling Your health care provider may ask you questions about your:  Alcohol use.  Tobacco use.  Drug use.  Emotional well-being.  Home and relationship well-being.  Sexual activity.  Eating habits.  Work and work Statistician.  Method of birth control.  Menstrual cycle.  Pregnancy history.  Screening You may have the following tests or measurements:  Height, weight, and BMI.  Blood pressure.  Lipid and cholesterol levels. These may be checked every 5 years, or more frequently if you are over 81 years old.  Skin check.  Lung cancer screening. You may have this screening every year starting at age 78 if you have a 30-pack-year history of smoking and currently smoke or have quit within the past 15 years.  Fecal occult blood test (FOBT) of the stool. You may have this test every year starting at age 65.  Flexible sigmoidoscopy or colonoscopy. You may have a sigmoidoscopy every 5 years or a colonoscopy  every 10 years starting at age 30.  Hepatitis C blood test.  Hepatitis B blood test.  Sexually transmitted disease (STD) testing.  Diabetes screening. This is done by checking your blood sugar (glucose) after you have not eaten for a while (fasting). You may have this done every 1-3 years.  Mammogram. This may be done every 1-2 years. Talk to your health care provider about when you should start having regular mammograms. This may depend on whether you have a family history of breast cancer.  BRCA-related cancer screening. This may be done if you have a family history of breast, ovarian, tubal, or peritoneal cancers.  Pelvic exam and Pap test. This may be done every 3 years starting at age 80. Starting at age 36, this may be done every 5 years if you have a Pap test in combination with an HPV test.  Bone density scan. This is done to screen for osteoporosis. You may have this scan if you are at high risk for osteoporosis.  Discuss your test results, treatment options, and if necessary, the need for more tests with your health care provider. Vaccines Your health care provider may recommend certain vaccines, such as:  Influenza vaccine. This is recommended every year.  Tetanus, diphtheria, and acellular pertussis (Tdap, Td) vaccine. You may need a Td booster every 10 years.  Varicella vaccine. You may need this if you have not been vaccinated.  Zoster vaccine. You may need this after age 5.  Measles, mumps, and rubella (MMR) vaccine. You may need at least one dose of MMR if you were born in  1957 or later. You may also need a second dose.  Pneumococcal 13-valent conjugate (PCV13) vaccine. You may need this if you have certain conditions and were not previously vaccinated.  Pneumococcal polysaccharide (PPSV23) vaccine. You may need one or two doses if you smoke cigarettes or if you have certain conditions.  Meningococcal vaccine. You may need this if you have certain  conditions.  Hepatitis A vaccine. You may need this if you have certain conditions or if you travel or work in places where you may be exposed to hepatitis A.  Hepatitis B vaccine. You may need this if you have certain conditions or if you travel or work in places where you may be exposed to hepatitis B.  Haemophilus influenzae type b (Hib) vaccine. You may need this if you have certain conditions.  Talk to your health care provider about which screenings and vaccines you need and how often you need them. This information is not intended to replace advice given to you by your health care provider. Make sure you discuss any questions you have with your health care provider. Document Released: 03/18/2015 Document Revised: 11/09/2015 Document Reviewed: 12/21/2014 Elsevier Interactive Patient Education  2018 Elsevier Inc.  

## 2017-04-23 ENCOUNTER — Other Ambulatory Visit: Payer: Self-pay | Admitting: Family Medicine

## 2017-04-23 NOTE — Addendum Note (Signed)
Addended by: Tommie RaymondBOOKER, [place] Kenley L on: 04/23/2017 10:27 AM   Modules accepted: Orders

## 2017-04-24 LAB — COMPREHENSIVE METABOLIC PANEL
A/G RATIO: 1.9 (ref 1.2–2.2)
ALBUMIN: 4.9 g/dL — AB (ref 3.6–4.8)
ALK PHOS: 70 IU/L (ref 39–117)
ALT: 26 IU/L (ref 0–32)
AST: 29 IU/L (ref 0–40)
BILIRUBIN TOTAL: 0.4 mg/dL (ref 0.0–1.2)
BUN / CREAT RATIO: 17 (ref 12–28)
BUN: 11 mg/dL (ref 8–27)
CHLORIDE: 100 mmol/L (ref 96–106)
CO2: 23 mmol/L (ref 20–29)
Calcium: 10.1 mg/dL (ref 8.7–10.3)
Creatinine, Ser: 0.64 mg/dL (ref 0.57–1.00)
GFR calc non Af Amer: 97 mL/min/{1.73_m2} (ref >59)
GFR, EST AFRICAN AMERICAN: 111 mL/min/{1.73_m2} (ref >59)
Globulin, Total: 2.6 g/dL (ref 1.5–4.5)
Glucose: 82 mg/dL (ref 65–99)
Potassium: 4.2 mmol/L (ref 3.5–5.2)
Sodium: 141 mmol/L (ref 134–144)
TOTAL PROTEIN: 7.5 g/dL (ref 6.0–8.5)

## 2017-04-24 LAB — CBC WITH DIFFERENTIAL/PLATELET
BASOS ABS: 0 10*3/uL (ref 0.0–0.2)
BASOS: 0 %
EOS (ABSOLUTE): 0.2 10*3/uL (ref 0.0–0.4)
EOS: 3 %
HEMATOCRIT: 43.9 % (ref 34.0–46.6)
HEMOGLOBIN: 14.6 g/dL (ref 11.1–15.9)
IMMATURE GRANS (ABS): 0 10*3/uL (ref 0.0–0.1)
Immature Granulocytes: 0 %
LYMPHS ABS: 1.6 10*3/uL (ref 0.7–3.1)
LYMPHS: 25 %
MCH: 29.8 pg (ref 26.6–33.0)
MCHC: 33.3 g/dL (ref 31.5–35.7)
MCV: 90 fL (ref 79–97)
MONOCYTES: 10 %
Monocytes Absolute: 0.7 10*3/uL (ref 0.1–0.9)
NEUTROS ABS: 4.1 10*3/uL (ref 1.4–7.0)
Neutrophils: 62 %
Platelets: 288 10*3/uL (ref 150–379)
RBC: 4.9 x10E6/uL (ref 3.77–5.28)
RDW: 13.5 % (ref 12.3–15.4)
WBC: 6.6 10*3/uL (ref 3.4–10.8)

## 2017-04-24 LAB — LIPID PANEL
CHOLESTEROL TOTAL: 235 mg/dL — AB (ref 100–199)
Chol/HDL Ratio: 3.1 ratio (ref 0.0–4.4)
HDL: 75 mg/dL (ref >39)
LDL CALC: 132 mg/dL — AB (ref 0–99)
Triglycerides: 142 mg/dL (ref 0–149)
VLDL CHOLESTEROL CAL: 28 mg/dL (ref 5–40)

## 2017-04-24 LAB — TSH: TSH: 1.22 u[IU]/mL (ref 0.450–4.500)

## 2017-04-24 LAB — HGB A1C W/O EAG: HEMOGLOBIN A1C: 5.5 % (ref 4.8–5.6)

## 2017-05-03 ENCOUNTER — Ambulatory Visit: Admit: 2017-05-03 | Discharge: 2017-05-03 | Payer: PRIVATE HEALTH INSURANCE

## 2017-05-03 DIAGNOSIS — Z1231 Encounter for screening mammogram for malignant neoplasm of breast: Principal | ICD-10-CM

## 2017-09-02 ENCOUNTER — Other Ambulatory Visit: Payer: Self-pay | Admitting: Family Medicine

## 2017-09-02 DIAGNOSIS — G8929 Other chronic pain: Secondary | ICD-10-CM

## 2017-09-02 DIAGNOSIS — M545 Low back pain: Principal | ICD-10-CM

## 2017-09-02 NOTE — Telephone Encounter (Signed)
Refill request for general medication: Tizanidine 4 mg  Last office visit: 04/22/2017  Last physical exam: 04/22/2017  Follow-ups on file. None indicated

## 2017-12-02 ENCOUNTER — Other Ambulatory Visit: Payer: Self-pay | Admitting: Family Medicine

## 2017-12-02 DIAGNOSIS — M545 Low back pain, unspecified: Secondary | ICD-10-CM

## 2017-12-02 DIAGNOSIS — G8929 Other chronic pain: Secondary | ICD-10-CM

## 2017-12-15 IMAGING — RF DG ESOPHAGUS
8 of 10 series · 12 of 24 positions shown · non-contrast
Comparison: None.

CLINICAL DATA: Trouble swallowing bread.

EXAM:
ESOPHOGRAM / BARIUM SWALLOW / BARIUM TABLET STUDY
TECHNIQUE: Combined double contrast and single contrast examination performed
using effervescent crystals, thick barium liquid, and thin barium
liquid. The patient was observed with fluoroscopy swallowing a 13 mm
barium sulphate tablet.
FLUOROSCOPY TIME:  Radiation Exposure Index (as provided by the
fluoroscopic device): 5.3 mGy

[Series 4: cp_standard · 0.52mm/px · 2 of 24 frames shown (1 of 8)]
[frame 4/24]
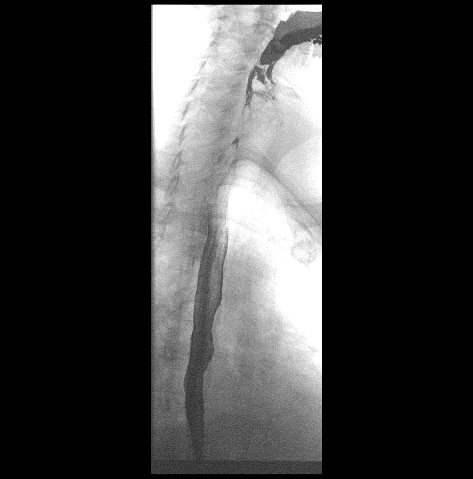
[frame 21/24]
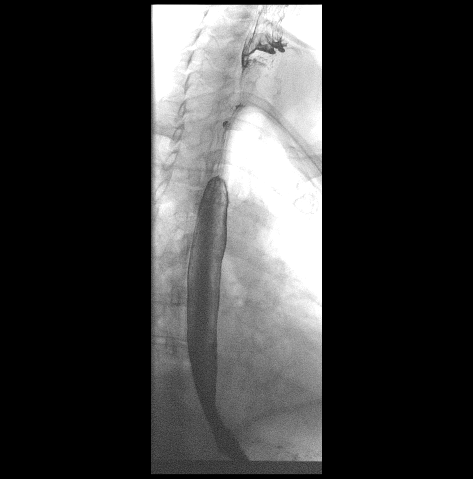

[Series 6: cp_standard · 0.52mm/px · 2 of 16 frames shown (2 of 8)]
[frame 3/16]
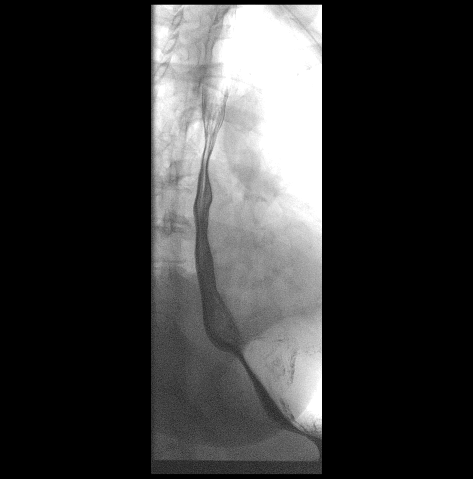
[frame 10/16]
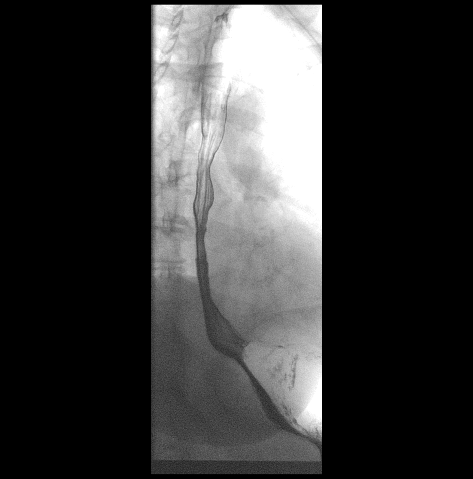

[Series 7: cp_standard · 0.52mm/px · 2 of 23 frames shown (3 of 8)]
[frame 1/23]
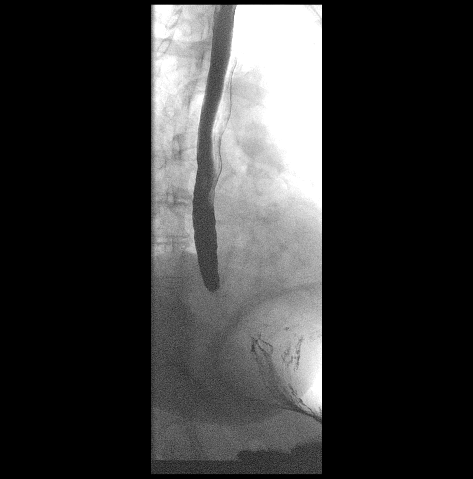
[frame 12/23]
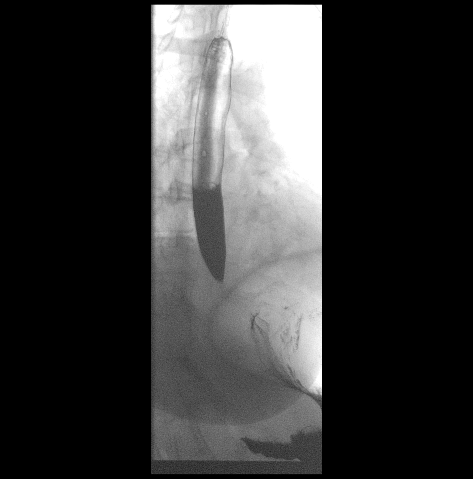

[Series 8: cp_standard · 0.52mm/px · 2 of 19 frames shown (4 of 8)]
[frame 3/19]
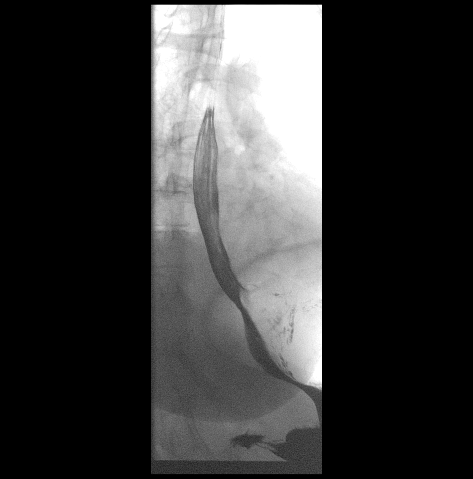
[frame 17/19]
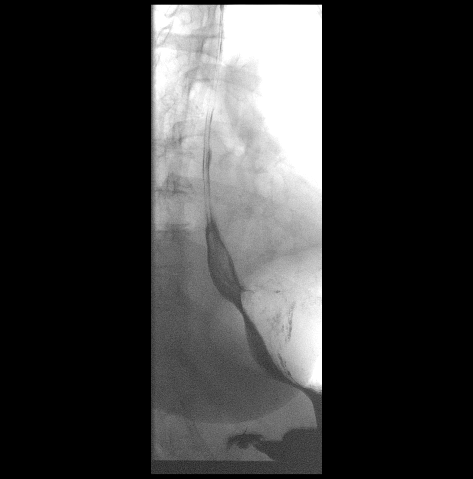

[Series 9: cp_standard · 0.28mm/px · 1 of 1 slices shown (5 of 8)]
[im 1/1]
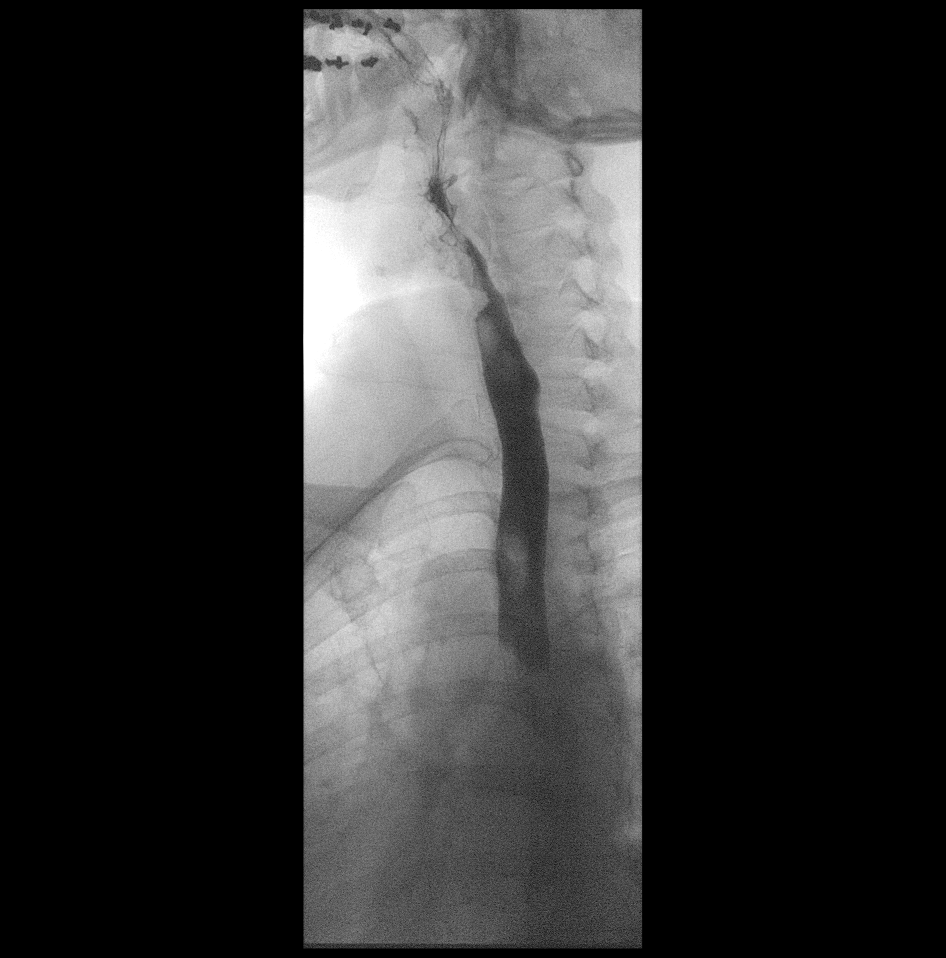

[Series 11: cp_standard · 0.28mm/px · 1 of 1 slices shown (6 of 8)]
[im 1/1]
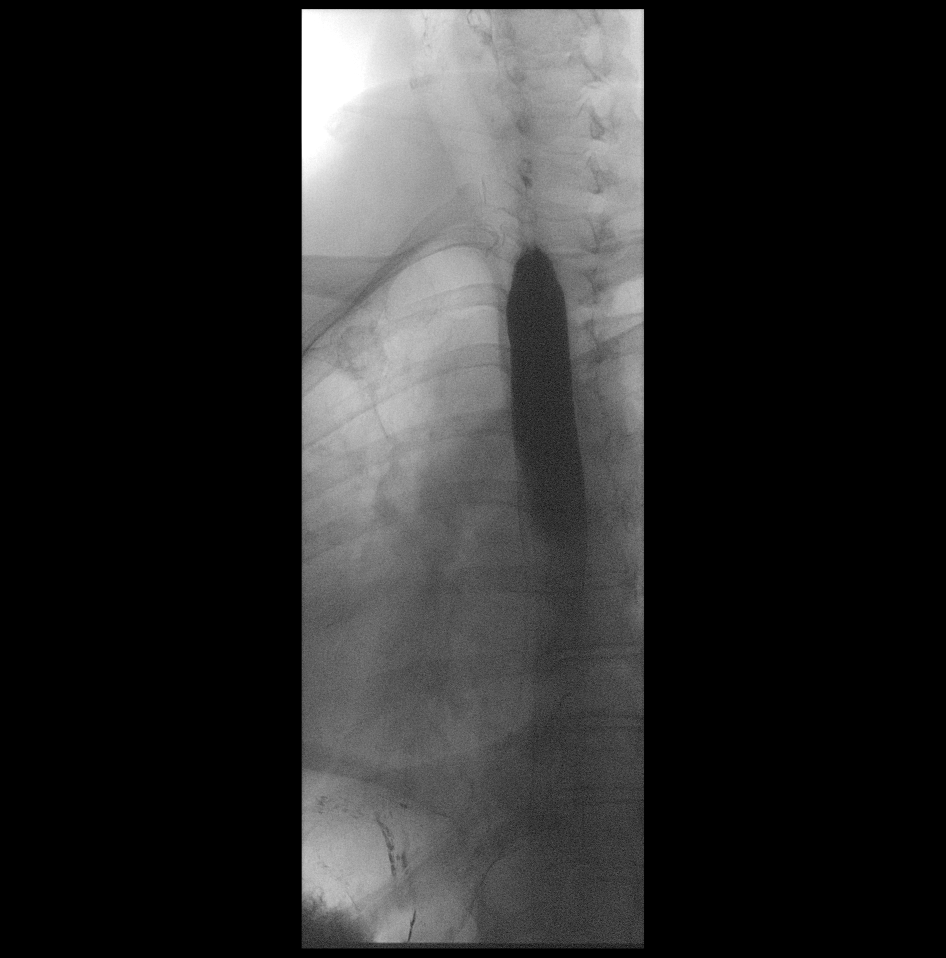

[Series 12: cp_standard · 0.56mm/px · 1 of 31 frames shown (7 of 8)]
[frame 16/31]
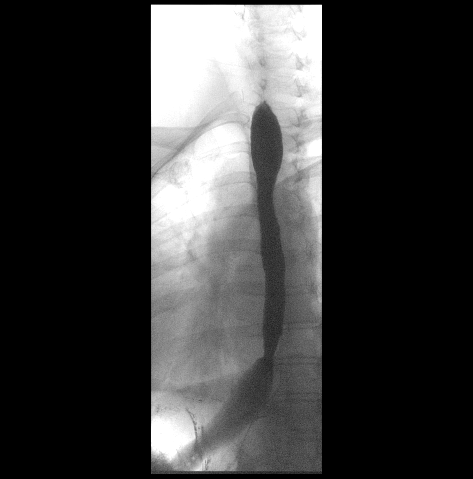

[Series 13: cp_standard · 0.28mm/px · 1 of 1 slices shown (8 of 8)]
[im 1/1]
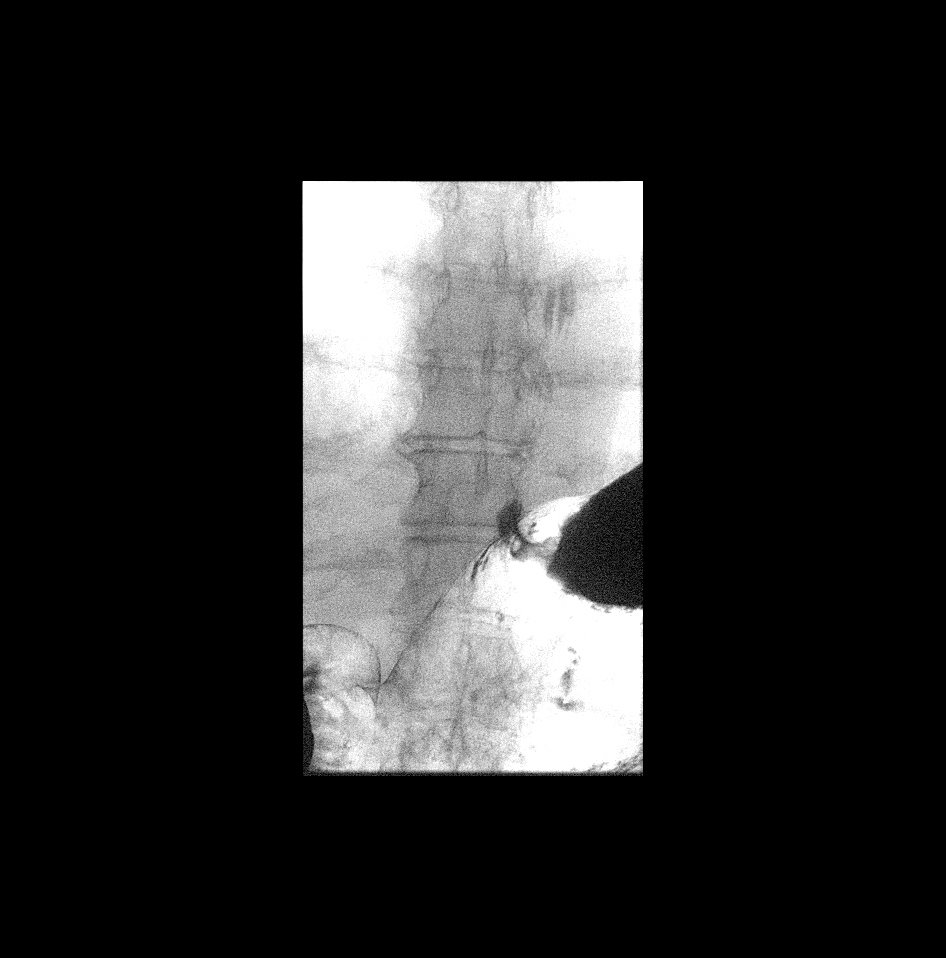

[12 of 24 positions shown; findings below may reference images not displayed]

FINDINGS: There was normal pharyngeal anatomy and motility. Contrast flowed
freely through the esophagus without evidence of stricture or mass.
There was normal esophageal mucosa without evidence of irregularity
or ulceration. Esophageal motility was normal. There is mild
gastroesophageal reflux. No definite hiatal hernia was demonstrated.

At the end of the examination a 13 mm barium tablet was administered
which transited through the esophagus and esophagogastric junction
without delay.
IMPRESSION: 1. Mild gastroesophageal reflux.
2. Otherwise normal barium swallow.

## 2018-04-30 ENCOUNTER — Ambulatory Visit: Payer: Self-pay | Admitting: *Deleted

## 2018-04-30 NOTE — Telephone Encounter (Signed)
Pt called with complaints of a "burning" or "raw" feeling when she takes a deep breath; she has a non-productive cough but hears congestion in her chest; she has had these symptoms have been for a week; the pt reports that she felt similar to this when she had bronchitis; she says that she has been exposed to 2 people who had pneumonia; recommendations made per nurse triage protocol; the pt would like to be seen by Dr Carlynn Purl only; she also states that she works in [place] and would like to be seen in the afternoon preferably on 05/02/2018; explained to pt this  is outside of the suggested parameters for her to be seen, and this provider has no availability for her requested times; pt offered and accepted appointment with Dr Carlynn Purl, [facility], 05/01/2018 at 0940; she verbalized understanding; will route to office for notification.     Reason for Disposition . [1] MILD difficulty breathing (e.g., minimal/no SOB at rest, SOB with walking, pulse <100) AND [2] NEW-onset or WORSE than normal  Answer Assessment - Initial Assessment Questions 1. RESPIRATORY STATUS: "Describe your breathing?" (e.g., wheezing, shortness of breath, unable to speak, severe coughing)      Hard to take a deep breath; described as "raw" or "burning"; sensation does get better with repeated deep breaths  2. ONSET: "When did this breathing problem begin?"      04/23/2018 3. PATTERN "Does the difficult breathing come and go, or has it been constant since it started?"      intermittent 4. SEVERITY: "How bad is your breathing?" (e.g., mild, moderate, severe)    - MILD: No SOB at rest, mild SOB with walking, speaks normally in sentences, can lay down, no retractions, pulse < 100.    - MODERATE: SOB at rest, SOB with minimal exertion and prefers to sit, cannot lie down flat, speaks in phrases, mild retractions, audible wheezing, pulse 100-120.    - SEVERE: Very SOB at rest, speaks in single words, struggling to breathe, sitting  hunched forward, retractions, pulse > 120      mild 5. RECURRENT SYMPTOM: "Have you had difficulty breathing before?" If so, ask: "When was the last time?" and "What happened that time?"      Yes when bronchitis 20 years ago 6. CARDIAC HISTORY: "Do you have any history of heart disease?" (e.g., heart attack, angina, bypass surgery, angioplasty)      Rapid heart rate 7. LUNG HISTORY: "Do you have any history of lung disease?"  (e.g., pulmonary embolus, asthma, emphysema)     history of bronchitis 8. CAUSE: "What do you think is causing the breathing problem?"      "I don't know" 9. OTHER SYMPTOMS: "Do you have any other symptoms? (e.g., dizziness, runny nose, cough, chest pain, fever)     Non productive cough 10. PREGNANCY: "Is there any chance you are pregnant?" "When was your last menstrual period?"       no 11. TRAVEL: "Have you traveled out of the country in the last month?" (e.g., travel history, exposures)       No, no  Protocols used: BREATHING DIFFICULTY-A-AH

## 2018-05-01 ENCOUNTER — Ambulatory Visit: Payer: BC Managed Care – PPO | Admitting: Family Medicine

## 2018-05-01 ENCOUNTER — Encounter: Payer: Self-pay | Admitting: Family Medicine

## 2018-05-01 ENCOUNTER — Ambulatory Visit
Admission: RE | Admit: 2018-05-01 | Discharge: 2018-05-01 | Disposition: A | Discharge status: Home / Self Care | Payer: BC Managed Care – PPO | Source: Ambulatory Visit | Attending: Family Medicine | Admitting: Family Medicine

## 2018-05-01 ENCOUNTER — Ambulatory Visit
Admission: RE | Admit: 2018-05-01 | Discharge: 2018-05-01 | Disposition: A | Discharge status: Home / Self Care | Payer: BC Managed Care – PPO | Attending: Family Medicine | Admitting: Family Medicine

## 2018-05-01 VITALS — BP 110/68 | HR 98 | Temp 97.4°F | Resp 16 | Ht 59.0 in | Wt 139.6 lb

## 2018-05-01 DIAGNOSIS — R0781 Pleurodynia: Secondary | ICD-10-CM | POA: Insufficient documentation

## 2018-05-01 DIAGNOSIS — R05 Cough: Secondary | ICD-10-CM | POA: Diagnosis not present

## 2018-05-01 DIAGNOSIS — R058 Other specified cough: Secondary | ICD-10-CM

## 2018-05-01 DIAGNOSIS — M791 Myalgia, unspecified site: Secondary | ICD-10-CM

## 2018-05-01 DIAGNOSIS — R002 Palpitations: Secondary | ICD-10-CM | POA: Diagnosis not present

## 2018-05-01 DIAGNOSIS — Z8639 Personal history of other endocrine, nutritional and metabolic disease: Secondary | ICD-10-CM

## 2018-05-01 DIAGNOSIS — K219 Gastro-esophageal reflux disease without esophagitis: Secondary | ICD-10-CM | POA: Insufficient documentation

## 2018-05-01 DIAGNOSIS — E7849 Other hyperlipidemia: Secondary | ICD-10-CM

## 2018-05-01 DIAGNOSIS — R0789 Other chest pain: Secondary | ICD-10-CM | POA: Diagnosis present

## 2018-05-01 DIAGNOSIS — T466X5A Adverse effect of antihyperlipidemic and antiarteriosclerotic drugs, initial encounter: Secondary | ICD-10-CM

## 2018-05-01 DIAGNOSIS — Z79899 Other long term (current) drug therapy: Secondary | ICD-10-CM

## 2018-05-01 DIAGNOSIS — Z131 Encounter for screening for diabetes mellitus: Secondary | ICD-10-CM

## 2018-05-01 MED ORDER — OMEPRAZOLE 40 MG PO CPDR: 40.0000 mg | DELAYED_RELEASE_CAPSULE | Freq: Every day | ORAL | 0 refills | Status: DC

## 2018-05-01 MED ORDER — EZETIMIBE 10 MG PO TABS: 10.0000 mg | ORAL_TABLET | Freq: Every day | ORAL | 0 refills | Status: DC

## 2018-05-01 NOTE — Progress Notes (Signed)
Name: Mia Camacho   MRN: [id number]    DOB: [birth year]   Date:05/01/2018       Progress Note  Subjective  Chief Complaint  Chief Complaint  Patient presents with  . Chest Discomfort    Onset-1 week, constant states her chest feels like a burning and raw sensation.  . Dyslipidemia    Had to stop Crestor due to muscle cramps    HPI  Pleuritic chest pain: she states about one month ago she had an URI , symptoms resolved however has a mild dry cough, over the past week she has noticed substernal chest pain. She states mild improvement with she burps. No change in weight. No SOB or decrease in exercise tolerance. No diaphoresis. Jaw pain or nausea /vomiting.  She has a history of GERD that was controlled with eating an apple a day but has not been consistent with a GERD diet for months now. She also has been more stressed . Mother had MI 3 weeks ago, a friend diagnosed with CAP and is in the ICU now. She is concerned about her health. She has a history of palpitation and mitral regurgitation, seen by Dr. Lady Gary in the past and given metoprolol that she resumed taking last night and feels like it has helped with palpitation.   Dyslipidemia: she stopped taking Crestor because of severe muscle pain, she is willing to try zetia   Patient Active Problem List   Diagnosis Date Noted  . Palpitation 12/07/2015  . Acute pain of right knee 11/22/2015  . Perennial allergic rhinitis 09/13/2014  . Back pain, chronic 09/13/2014  . Diverticulosis 09/13/2014  . Bursitis, trochanteric 09/13/2014  . Herpes 09/13/2014  . Hiatal hernia 09/13/2014  . H/O: hysterectomy 09/13/2014  . Personal history of urinary calculi 09/13/2014  . Menopause 09/13/2014  . Hernia, rectovaginal 09/13/2014  . Primary osteoarthritis of both knees 09/13/2014  . Pelvic muscle wasting 09/13/2014  . Cleft palate 09/13/2014  . 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/13/2014  . Epicondylitis elbow, medial 03/18/2014  . Atrophy of vagina  03/15/2011  . Gastroesophageal reflux disease with hiatal hernia 06/28/2009  . Vitamin D deficiency 04/05/2009  . Familial hyperlipidemia, high LDL 02/10/2009  . History of Graves' disease 02/10/2009    Past Surgical History:  Procedure Laterality Date  . ABDOMINAL HYSTERECTOMY    . bladder tact    . ROTATOR CUFF REPAIR      Family History  Problem Relation Age of Onset  . Hyperlipidemia Mother   . Hypertension Mother   . Diabetes Mother   . Heart attack Mother 56  . Cancer Father     Social History   Socioeconomic History  . Marital status: Married    Spouse name: Darrell   . Number of children: 3  . Years of education: Not on file  . Highest education level: Some college, no degree  Occupational History  . Not on file  Social Needs  . Financial resource strain: Not hard at all  . Food insecurity:    Worry: Never true    Inability: Never true  . Transportation needs:    Medical: No    Non-medical: No  Tobacco Use  . Smoking status: Never Smoker  . Smokeless tobacco: Never Used  Substance and Sexual Activity  . Alcohol use: No    Alcohol/week: 0.0 standard drinks  . Drug use: No  . Sexual activity: Yes    Partners: Male  Lifestyle  . Physical activity:    Days  per week: 2 days    Minutes per session: 30 min  . Stress: Not at all  Relationships  . Social connections:    Talks on phone: More than three times a week    Gets together: More than three times a week    Attends religious service: More than 4 times per year    Active member of club or organization: Yes    Attends meetings of clubs or organizations: More than 4 times per year    Relationship status: Married  . Intimate partner violence:    Fear of current or ex partner: No    Emotionally abused: No    Physically abused: No    Forced sexual activity: No  Other Topics Concern  . Not on file  Social History Narrative  . Not on file     Current Outpatient Medications:  .  cholecalciferol  (VITAMIN D) 1000 units tablet, Take 1,000 Units by mouth as needed. , Disp: , Rfl:  .  CINNAMON PO, Take 1 capsule by mouth daily. 1000 mg, Disp: , Rfl:  .  co-enzyme Q-10 50 MG capsule, Take 400 mg by mouth daily. , Disp: , Rfl:  .  metoprolol tartrate (LOPRESSOR) 25 MG tablet, Take 25 mg by mouth 2 (two) times daily as needed (Anxiety)., Disp: , Rfl:  .  mometasone (NASONEX) 50 MCG/ACT nasal spray, Place 2 sprays into the nose daily. (Patient taking differently: Place 2 sprays into the nose as needed. ), Disp: 17 g, Rfl: 6 .  Multiple Vitamin (MULTIVITAMIN) capsule, Take 1 capsule by mouth daily. , Disp: , Rfl:  .  Omega-3 1000 MG CAPS, Take 1,200 mg by mouth daily. , Disp: , Rfl:  .  polyethylene glycol (MIRALAX / GLYCOLAX) packet, Take 1 packet by mouth daily., Disp: , Rfl:  .  tiZANidine (ZANAFLEX) 4 MG tablet, TAKE 1 TABLET (4 MG TOTAL) BY MOUTH 3 (THREE) TIMES DAILY. (Patient taking differently: Take 4 mg by mouth as needed for muscle spasms. ), Disp: 90 tablet, Rfl: 0 .  vitamin E 1000 UNIT capsule, Take 400 Units by mouth daily. , Disp: , Rfl:  .  meloxicam (MOBIC) 15 MG tablet, TAKE 1 TABLET (15 MG TOTAL) BY MOUTH DAILY. (Patient not taking: Reported on 05/01/2018), Disp: 90 tablet, Rfl: 0 .  rosuvastatin (CRESTOR) 10 MG tablet, TAKE 1 TABLET BY MOUTH EVERY DAY FOR CHOLESTEROL (Patient not taking: Reported on 05/01/2018), Disp: 90 tablet, Rfl: 1  Allergies  Allergen Reactions  . Aspirin Rash  . Sulfa Antibiotics Rash  . Penicillins Nausea And Vomiting  . Latex Rash    Other Reaction: WELTS    I personally reviewed active problem list, medication list, allergies, family history, social history with the patient/caregiver today.   ROS   Constitutional: Negative for fever or weight change.  Respiratory: positive  for mild cough and shortness of breath.   Cardiovascular: positive for pleuritic  chest pain and has intermittent palpitations and a little worse over the past week    Gastrointestinal: Negative for abdominal pain, no bowel changes.  Musculoskeletal: Negative for gait problem or joint swelling.  Skin: Negative for rash.  Neurological: Positive  for intermittent dizziness ( seen by Dr. Lady Gary for that in the past ) but no headache.  No other specific complaints in a complete review of systems (except as listed in HPI above).  Objective  Vitals:   05/01/18 0935  BP: 110/68  Pulse: 98  Resp: 16  Temp: Marland Kitchen)  97.4 F (36.3 C)  TempSrc: Oral  SpO2: 96%  Weight: 139 lb 9.6 oz (63.3 kg)  Height:  (1.499 m)    Body mass index is 28.2 kg/m.  Physical Exam  Constitutional: Patient appears well-developed and well-nourished.  No distress.  HEENT: head atraumatic, normocephalic, pupils equal and reactive to light,  neck supple, throat within normal limits Cardiovascular: Normal rate, regular rhythm and normal heart sounds.  2 plus  murmur heard 2nd intercostal space right side . No BLE edema. Pulmonary/Chest: Effort normal and breath sounds normal. No respiratory distress. Abdominal: Soft.  There is mild epigastric  tenderness. Psychiatric: Patient has a normal mood and affect. behavior is normal. Judgment and thought content normal.  PHQ2/9: Depression screen [facility] 2/9 05/01/2018 04/22/2017 05/25/2016 11/25/2015 10/25/2015  Decreased Interest 0 0 0 0 0  Down, Depressed, Hopeless 0 0 0 0 0  PHQ - 2 Score 0 0 0 0 0  Altered sleeping 0 - - - -  Tired, decreased energy 0 - - - -  Change in appetite 0 - - - -  Feeling bad or failure about yourself  0 - - - -  Trouble concentrating 0 - - - -  Moving slowly or fidgety/restless 0 - - - -  Suicidal thoughts 0 - - - -  PHQ-9 Score 0 - - - -  Difficult doing work/chores Not difficult at all - - - -     Fall Risk: Fall Risk  05/01/2018 04/22/2017 05/25/2016 11/25/2015 10/25/2015  Falls in the past year? 1 Yes No No No  Comment July 2019 - - - -  Number falls in past yr: 0 1 - - -  Comment tripped at a gas  station - - - -  Injury with Fall? 1 Yes - - -  Comment contusions on her buttock - - - -  Risk for fall due to : - History of fall(s) - - -  Follow up - Education provided - - -     Functional Status Survey: Is the patient deaf or have difficulty hearing?: No Does the patient have difficulty seeing, even when wearing glasses/contacts?: No Does the patient have difficulty concentrating, remembering, or making decisions?: No Does the patient have difficulty walking or climbing stairs?: No Does the patient have difficulty dressing or bathing?: No Does the patient have difficulty doing errands alone such as visiting a doctor's office or shopping?: No   Assessment & Plan  1. Pleuritic chest pain  - DG Chest 2 View; Future  2. Palpitation  - CBC with Differential/Platelet  3. Familial hyperlipidemia, high LDL  - ezetimibe (ZETIA) 10 MG tablet; Take 1 tablet (10 mg total) by mouth daily.  Dispense: 90 tablet; Refill: 0 - Lipid panel  4. Dry cough  - DG Chest 2 View; Future  5. Diabetes mellitus screening  - Hemoglobin A1c  6. History of Graves' disease  - TSH  7. Long-term use of high-risk medication  - CBC with Differential/Platelet - COMPLETE METABOLIC PANEL WITH GFR   8. GERD without esophagitis  - omeprazole (PRILOSEC) 40 MG capsule; Take 1 capsule (40 mg total) by mouth daily.  Dispense: 90 capsule; Refill: 0

## 2018-07-08 ENCOUNTER — Encounter: Payer: BC Managed Care – PPO | Admitting: Family Medicine

## 2018-07-17 ENCOUNTER — Other Ambulatory Visit: Payer: Self-pay | Admitting: Family Medicine

## 2018-07-17 DIAGNOSIS — J3089 Other allergic rhinitis: Secondary | ICD-10-CM

## 2018-07-28 ENCOUNTER — Other Ambulatory Visit: Payer: Self-pay | Admitting: Family Medicine

## 2018-07-28 DIAGNOSIS — E7849 Other hyperlipidemia: Secondary | ICD-10-CM

## 2018-08-03 ENCOUNTER — Other Ambulatory Visit: Payer: Self-pay | Admitting: Family Medicine

## 2018-08-03 DIAGNOSIS — K219 Gastro-esophageal reflux disease without esophagitis: Secondary | ICD-10-CM

## 2018-10-21 ENCOUNTER — Ambulatory Visit (INDEPENDENT_AMBULATORY_CARE_PROVIDER_SITE_OTHER): Payer: BC Managed Care – PPO | Admitting: Family Medicine

## 2018-10-21 ENCOUNTER — Encounter: Payer: Self-pay | Admitting: Family Medicine

## 2018-10-21 ENCOUNTER — Other Ambulatory Visit: Payer: Self-pay

## 2018-10-21 VITALS — BP 130/80 | HR 84 | Temp 97.1°F | Resp 16 | Ht 59.0 in | Wt 140.8 lb

## 2018-10-21 DIAGNOSIS — E2839 Other primary ovarian failure: Secondary | ICD-10-CM

## 2018-10-21 DIAGNOSIS — E7849 Other hyperlipidemia: Secondary | ICD-10-CM | POA: Diagnosis not present

## 2018-10-21 DIAGNOSIS — Z01419 Encounter for gynecological examination (general) (routine) without abnormal findings: Secondary | ICD-10-CM | POA: Diagnosis not present

## 2018-10-21 DIAGNOSIS — Z1239 Encounter for other screening for malignant neoplasm of breast: Secondary | ICD-10-CM | POA: Diagnosis not present

## 2018-10-21 DIAGNOSIS — Z23 Encounter for immunization: Secondary | ICD-10-CM | POA: Diagnosis not present

## 2018-10-21 MED ORDER — EZETIMIBE 10 MG PO TABS: 10.0000 mg | ORAL_TABLET | Freq: Every day | ORAL | 1 refills | Status: DC

## 2018-10-21 NOTE — Patient Instructions (Signed)

## 2018-10-21 NOTE — Progress Notes (Signed)
Name: Mia Camacho   MRN: [id number]    DOB: 03-15-1955   Date:10/21/2018       Progress Note  Subjective  Chief Complaint  Chief Complaint  Patient presents with  . Annual Exam    HPI   Patient presents for annual CPE and follow up  Diet: limiting carbs, but has been eating more sweets lately Exercise: she will increase frequency, doing 10-15 minutes 5 days a week   USPSTF grade A and B recommendations    Office Visit from 10/21/2018 in [facility]  AUDIT-C Score  0     Hypertension: BP Readings from Last 3 Encounters:  10/21/18 130/80  05/01/18 110/68  04/22/17 112/70   Obesity: Wt Readings from Last 3 Encounters:  10/21/18 140 lb 12.8 oz (63.9 kg)  05/01/18 139 lb 9.6 oz (63.3 kg)  04/22/17 142 lb 3.2 oz (64.5 kg)   BMI Readings from Last 3 Encounters:  10/21/18 28.44 kg/m  05/01/18 28.20 kg/m  04/22/17 24.41 kg/m    Hep C Screening: negative 2013  STD testing and prevention (HIV/chl/gon/syphilis): N/A Intimate partner violence: negative screen  Sexual History/Pain during Intercourse: no pain or bleeding  Menstrual History/LMP/Abnormal Bleeding: discussed post-menopausal bleeding  Incontinence Symptoms:   Advanced Care Planning: A voluntary discussion about advance care planning including the explanation and discussion of advance directives.  Discussed health care proxy and Living will, and the patient was able to identify a health care proxy as husband .  Patient does have a living will at present time. If patient does have living will, I have requested they bring this to the clinic to be scanned in to their chart.  Breast cancer: due now  BRCA gene screening: N/A Cervical cancer screening: s/p hysterectomy   Osteoporosis Screening:   Lipids:  Lab Results  Component Value Date   CHOL 235 (H) 04/23/2017   CHOL 247 (H) 05/25/2016   CHOL 263 (H) 01/03/2015   Lab Results  Component Value Date   HDL 75 04/23/2017   HDL 85  05/25/2016   HDL 72 01/03/2015   Lab Results  Component Value Date   LDLCALC 132 (H) 04/23/2017   LDLCALC 150 (H) 05/25/2016   LDLCALC 160 (H) 01/03/2015   Lab Results  Component Value Date   TRIG 142 04/23/2017   TRIG 62 05/25/2016   TRIG 153 (H) 01/03/2015   Lab Results  Component Value Date   CHOLHDL 3.1 04/23/2017   CHOLHDL 2.9 05/25/2016   CHOLHDL 3.7 01/03/2015   No results found for: LDLDIRECT  Glucose:  Glucose  Date Value Ref Range Status  04/23/2017 82 65 - 99 mg/dL Final  01/03/2015 90 65 - 99 mg/dL Final   Glucose, Bld  Date Value Ref Range Status  05/25/2016 93 65 - 99 mg/dL Final    Skin cancer: discussed atypical lesions  Colorectal cancer: 2027    Lung cancer:   Low Dose CT Chest recommended if Age 80-80 years, 30 pack-year currently smoking OR have quit w/in 15years. Patient does not qualify.   ZDG:UYQIH , sinus rhythm   Patient Active Problem List   Diagnosis Date Noted  . Myalgia due to statin 05/01/2018  . GERD without esophagitis 05/01/2018  . Palpitation 12/07/2015  . Acute pain of right knee 11/22/2015  . Perennial allergic rhinitis 09/13/2014  . Back pain, chronic 09/13/2014  . Diverticulosis 09/13/2014  . Bursitis, trochanteric 09/13/2014  . Herpes 09/13/2014  . Hiatal hernia 09/13/2014  . H/O: hysterectomy  09/13/2014  . Personal history of urinary calculi 09/13/2014  . Menopause 09/13/2014  . Hernia, rectovaginal 09/13/2014  . Primary osteoarthritis of both knees 09/13/2014  . Pelvic muscle wasting 09/13/2014  . Cleft palate 09/13/2014  . 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/13/2014  . Epicondylitis elbow, medial 03/18/2014  . 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 Gastroesophageal reflux disease with hiatal hernia 06/28/2009  . Vitamin D deficiency 04/05/2009  . Familial hyperlipidemia, high LDL 02/10/2009  . History of Graves' disease 02/10/2009    Past Surgical History:  Procedure Laterality Date  . ABDOMINAL HYSTERECTOMY    .  bladder tact    . ROTATOR CUFF REPAIR      Family History  Problem Relation Age of Onset  . Hyperlipidemia Mother   . Hypertension Mother   . Diabetes Mother   . Heart attack Mother 67  . Cancer Father     Social History   Socioeconomic History  . Marital status: Married    Spouse name: Darrell   . Number of children: 3  . Years of education: Not on file  . Highest education level: Some college, no degree  Occupational History  . Not on file  Social Needs  . Financial resource strain: Not hard at all  . Food insecurity    Worry: Never true    Inability: Never true  . Transportation needs    Medical: No    Non-medical: No  Tobacco Use  . Smoking status: Never Smoker  . Smokeless tobacco: Never Used  Substance and Sexual Activity  . Alcohol use: No    Alcohol/week: 0.0 standard drinks  . Drug use: No  . Sexual activity: Yes    Partners: Male  Lifestyle  . Physical activity    Days per week: 5 days    Minutes per session: 20 min  . Stress: Not at all  Relationships  . Social connections    Talks on phone: More than three times a week    Gets together: More than three times a week    Attends religious service: More than 4 times per year    Active member of club or organization: Yes    Attends meetings of clubs or organizations: More than 4 times per year    Relationship status: Married  . Intimate partner violence    Fear of current or ex partner: No    Emotionally abused: No    Physically abused: No    Forced sexual activity: No  Other Topics Concern  . Not on file  Social History Narrative  . Not on file     Current Outpatient Medications:  .  cholecalciferol (VITAMIN D) 1000 units tablet, Take 1,000 Units by mouth as needed. , Disp: , Rfl:  .  CINNAMON PO, Take 1 capsule by mouth daily. 1000 mg, Disp: , Rfl:  .  co-enzyme Q-10 50 MG capsule, Take 400 mg by mouth daily. , Disp: , Rfl:  .  ezetimibe (ZETIA) 10 MG tablet, TAKE 1 TABLET BY MOUTH EVERY  DAY, Disp: 90 tablet, Rfl: 0 .  meloxicam (MOBIC) 15 MG tablet, TAKE 1 TABLET (15 MG TOTAL) BY MOUTH DAILY., Disp: 90 tablet, Rfl: 0 .  metoprolol tartrate (LOPRESSOR) 25 MG tablet, Take 25 mg by mouth 2 (two) times daily as needed (Anxiety)., Disp: , Rfl:  .  mometasone (NASONEX) 50 MCG/ACT nasal spray, PLACE 2 SPRAYS INTO THE NOSE DAILY., Disp: 17 g, Rfl: 6 .  Multiple Vitamin (MULTIVITAMIN) capsule, Take 1 capsule  by mouth daily. , Disp: , Rfl:  .  Omega-3 1000 MG CAPS, Take 1,200 mg by mouth daily. , Disp: , Rfl:  .  omeprazole (PRILOSEC) 40 MG capsule, TAKE 1 CAPSULE BY MOUTH EVERY DAY, Disp: 90 capsule, Rfl: 0 .  polyethylene glycol (MIRALAX / GLYCOLAX) packet, Take 1 packet by mouth daily., Disp: , Rfl:  .  tiZANidine (ZANAFLEX) 4 MG tablet, TAKE 1 TABLET (4 MG TOTAL) BY MOUTH 3 (THREE) TIMES DAILY. (Patient taking differently: Take 4 mg by mouth as needed for muscle spasms. ), Disp: 90 tablet, Rfl: 0 .  vitamin E 1000 UNIT capsule, Take 400 Units by mouth daily. , Disp: , Rfl:   Allergies  Allergen Reactions  . Aspirin Rash  . Sulfa Antibiotics Rash  . Penicillins Nausea And Vomiting  . Statins     Muscle pain  . Latex Rash    Other Reaction: WELTS     ROS  Constitutional: Negative for fever or weight change.  Respiratory: Negative for cough and shortness of breath.   Cardiovascular: Negative for chest pain or palpitations.  Gastrointestinal: Negative for abdominal pain, no bowel changes.  Musculoskeletal: Negative for gait problem or joint swelling.  Skin: Negative for rash.  Neurological: Negative for dizziness or headache.  No other specific complaints in a complete review of systems (except as listed in HPI above).  Objective  Vitals:   10/21/18 1016  BP: 130/80  Pulse: 84  Resp: 16  Temp: (!) 97.1 F (36.2 C)  TempSrc: Temporal  SpO2: 97%  Weight: 140 lb 12.8 oz (63.9 kg)  Height: 4' 11"  (1.499 m)    Body mass index is 28.44 kg/m.  Physical  Exam  Constitutional: Patient appears well-developed and well-nourished. Overweight No distress.  HENT: Head: Normocephalic and atraumatic. Ears: B TMs ok, no erythema or effusion Eyes: Conjunctivae and EOM are normal. Pupils are equal, round, and reactive to light. No scleral icterus.  Neck: Normal range of motion. Neck supple. No JVD present. No thyromegaly present.  Cardiovascular: Normal rate, regular rhythm and normal heart sounds.  No murmur heard. No BLE edema. Pulmonary/Chest: Effort normal and breath sounds normal. No respiratory distress. Abdominal: Soft. Bowel sounds are normal, no distension. There is no tenderness. no masses Breast: no lumps or masses, no nipple discharge or rashes FEMALE GENITALIA:  Not done RECTAL: not done Musculoskeletal: Normal range of motion, no joint effusions. No gross deformities Neurological: he is alert and oriented to person, place, and time. No cranial nerve deficit. Coordination, balance, strength, speech and gait are normal.  Skin: Skin is warm and dry. No rash noted. No erythema.  Psychiatric: Patient has a normal mood and affect. behavior is normal. Judgment and thought content normal.  PHQ2/9: Depression screen [facility] 2/9 10/21/2018 05/01/2018 04/22/2017 05/25/2016 11/25/2015  Decreased Interest 0 0 0 0 0  Down, Depressed, Hopeless 0 0 0 0 0  PHQ - 2 Score 0 0 0 0 0  Altered sleeping 0 0 - - -  Tired, decreased energy 0 0 - - -  Change in appetite 0 0 - - -  Feeling bad or failure about yourself  0 0 - - -  Trouble concentrating 0 0 - - -  Moving slowly or fidgety/restless 0 0 - - -  Suicidal thoughts 0 0 - - -  PHQ-9 Score 0 0 - - -  Difficult doing work/chores - Not difficult at all - - -   Negative   Fall Risk: Fall  Risk  10/21/2018 05/01/2018 04/22/2017 05/25/2016 11/25/2015  Falls in the past year? 0 1 Yes No No  Comment - July 2019 - - -  Number falls in past yr: 0 0 1 - -  Comment - tripped at a gas station - - -  Injury with Fall? 0  1 Yes - -  Comment - contusions on her buttock - - -  Risk for fall due to : - - History of fall(s) - -  Follow up - - Education provided - -    Functional Status Survey: Is the patient deaf or have difficulty hearing?: No Does the patient have difficulty seeing, even when wearing glasses/contacts?: No Does the patient have difficulty concentrating, remembering, or making decisions?: No Does the patient have difficulty walking or climbing stairs?: No Does the patient have difficulty dressing or bathing?: No Does the patient have difficulty doing errands alone such as visiting a doctor's office or shopping?: No   Assessment & Plan  1. Well woman exam  - EKG 12-Lead  2. Familial hyperlipidemia, high LDL  - ezetimibe (ZETIA) 10 MG tablet; Take 1 tablet (10 mg total) by mouth daily.  Dispense: 90 tablet; Refill: 1  3. Breast cancer screening  - MM Digital Screening; Future  4. Ovarian failure  - DG Bone Density; Future  5. Need for shingles vaccine  - Varicella-zoster vaccine IM   -USPSTF grade A and B recommendations reviewed with patient; age-appropriate recommendations, preventive care, screening tests, etc discussed and encouraged; healthy living encouraged; see AVS for patient education given to patient -Discussed importance of 150 minutes of physical activity weekly, eat two servings of fish weekly, eat one serving of tree nuts ( cashews, pistachios, pecans, almonds.Marland Kitchen) every other day, eat 6 servings of fruit/vegetables daily and drink plenty of water and avoid sweet beverages.

## 2018-10-22 ENCOUNTER — Encounter: Payer: Self-pay | Admitting: Family Medicine

## 2018-11-26 LAB — CBC WITH DIFFERENTIAL/PLATELET
Absolute Monocytes: 454 cells/uL (ref 200–950)
Basophils Absolute: 20 cells/uL (ref 0–200)
Basophils Relative: 0.4 %
Eosinophils Absolute: 168 cells/uL (ref 15–500)
Eosinophils Relative: 3.3 %
HCT: 44.5 % (ref 35.0–45.0)
Hemoglobin: 14.7 g/dL (ref 11.7–15.5)
Lymphs Abs: 1525 cells/uL (ref 850–3900)
MCH: 29.5 pg (ref 27.0–33.0)
MCHC: 33 g/dL (ref 32.0–36.0)
MCV: 89.4 fL (ref 80.0–100.0)
MPV: 10.1 fL (ref 7.5–12.5)
Monocytes Relative: 8.9 %
Neutro Abs: 2933 cells/uL (ref 1500–7800)
Neutrophils Relative %: 57.5 %
Platelets: 303 10*3/uL (ref 140–400)
RBC: 4.98 10*6/uL (ref 3.80–5.10)
RDW: 12.7 % (ref 11.0–15.0)
Total Lymphocyte: 29.9 %
WBC: 5.1 10*3/uL (ref 3.8–10.8)

## 2018-11-26 LAB — COMPLETE METABOLIC PANEL WITHOUT GFR
AG Ratio: 1.7 (calc) (ref 1.0–2.5)
ALT: 16 U/L (ref 6–29)
AST: 22 U/L (ref 10–35)
Albumin: 4.3 g/dL (ref 3.6–5.1)
Alkaline phosphatase (APISO): 58 U/L (ref 37–153)
BUN: 12 mg/dL (ref 7–25)
CO2: 26 mmol/L (ref 20–32)
Calcium: 9.6 mg/dL (ref 8.6–10.4)
Chloride: 103 mmol/L (ref 98–110)
Creat: 0.71 mg/dL (ref 0.50–0.99)
GFR, Est African American: 105 mL/min/{1.73_m2}
GFR, Est Non African American: 91 mL/min/{1.73_m2}
Globulin: 2.5 g/dL (ref 1.9–3.7)
Glucose, Bld: 86 mg/dL (ref 65–99)
Potassium: 4.2 mmol/L (ref 3.5–5.3)
Sodium: 138 mmol/L (ref 135–146)
Total Bilirubin: 0.5 mg/dL (ref 0.2–1.2)
Total Protein: 6.8 g/dL (ref 6.1–8.1)

## 2018-11-26 LAB — LIPID PANEL
Cholesterol: 317 mg/dL — ABNORMAL HIGH
HDL: 66 mg/dL
LDL Cholesterol (Calc): 225 mg/dL — ABNORMAL HIGH
Non-HDL Cholesterol (Calc): 251 mg/dL — ABNORMAL HIGH
Total CHOL/HDL Ratio: 4.8 (calc)
Triglycerides: 115 mg/dL

## 2018-11-26 LAB — HEMOGLOBIN A1C
Hgb A1c MFr Bld: 5.4 %{Hb}
Mean Plasma Glucose: 108 (calc)
eAG (mmol/L): 6 (calc)

## 2018-11-26 LAB — TSH: TSH: 0.77 m[IU]/L (ref 0.40–4.50)

## 2018-12-16 ENCOUNTER — Other Ambulatory Visit: Payer: Self-pay | Admitting: Family Medicine

## 2018-12-16 DIAGNOSIS — Z1239 Encounter for other screening for malignant neoplasm of breast: Secondary | ICD-10-CM

## 2018-12-23 ENCOUNTER — Telehealth: Payer: Self-pay

## 2018-12-23 ENCOUNTER — Ambulatory Visit: Payer: BC Managed Care – PPO

## 2018-12-23 DIAGNOSIS — Z1231 Encounter for screening mammogram for malignant neoplasm of breast: Principal | ICD-10-CM

## 2018-12-23 NOTE — Telephone Encounter (Signed)
Copied from [place] [id number]. Topic: General - Other >> Dec 23, 2018  1:57 PM Pauline Good wrote: Reason for CRM: pt want to have her bone density done at [facility] where she is having her  mammo. Please fax order to [phone]. pt's mammogram is tomorrow and she want to get this done also tomorrow. Please call pt to advise when done..   Please sign form in inbox and fax.

## 2018-12-24 ENCOUNTER — Ambulatory Visit: Admit: 2018-12-24 | Discharge: 2018-12-25 | Payer: PRIVATE HEALTH INSURANCE

## 2018-12-24 DIAGNOSIS — E2839 Other primary ovarian failure: Principal | ICD-10-CM

## 2018-12-24 LAB — HM MAMMOGRAPHY

## 2018-12-24 NOTE — Telephone Encounter (Signed)
Form has been placed in green folder.

## 2018-12-24 NOTE — Telephone Encounter (Signed)
Forms have been faxed. [phone]

## 2018-12-24 NOTE — Telephone Encounter (Signed)
Patient calling back about forms. She states they are needing to be signed and faxed by 9:00. She is requesting a call back as soon as it is sent.

## 2019-02-03 ENCOUNTER — Ambulatory Visit: Admit: 2019-02-03 | Discharge: 2019-02-04 | Payer: PRIVATE HEALTH INSURANCE | Attending: Family | Primary: Family

## 2019-03-25 ENCOUNTER — Encounter: Payer: Self-pay | Admitting: Family Medicine

## 2019-04-15 ENCOUNTER — Ambulatory Visit: Admit: 2019-04-15 | Discharge: 2019-04-16 | Payer: PRIVATE HEALTH INSURANCE

## 2019-04-21 ENCOUNTER — Telehealth: Payer: Self-pay

## 2019-04-21 NOTE — Telephone Encounter (Signed)
Copied from CRM [id number]. Topic: General - Other >> Apr 21, 2019 11:54 AM Herby Abraham C wrote: Reason for CRM: pt says that she is due to have 2nd shingles shot. Pt says that she is also due to have her second covid vaccine on 3/3. Pt would like to know if she is able to have both?   Please advised  CB: [phone]

## 2019-04-21 NOTE — Telephone Encounter (Signed)
Spoke with patient and informed her Dr. Carlynn Purl recommendations is to get the second COVID vaccine first, then wait 45 days afterwards before she can come in to get 2nd dose of shingrix. Patient verbalized understanding.

## 2019-04-24 ENCOUNTER — Ambulatory Visit: Payer: BC Managed Care – PPO | Admitting: Family Medicine

## 2019-05-13 ENCOUNTER — Ambulatory Visit: Admit: 2019-05-13 | Discharge: 2019-05-14 | Payer: PRIVATE HEALTH INSURANCE

## 2019-06-21 ENCOUNTER — Other Ambulatory Visit: Payer: Self-pay | Admitting: Family Medicine

## 2019-06-21 DIAGNOSIS — E7849 Other hyperlipidemia: Secondary | ICD-10-CM

## 2019-06-21 NOTE — Telephone Encounter (Signed)
Requested Prescriptions  Pending Prescriptions Disp Refills  . ezetimibe (ZETIA) 10 MG tablet [Pharmacy Med Name: EZETIMIBE 10 MG TABLET] 90 tablet 1    Sig: TAKE 1 TABLET BY MOUTH EVERY DAY     Cardiovascular:  Antilipid - Sterol Transport Inhibitors Failed - 06/21/2019  9:38 AM      Failed - Total Cholesterol in normal range and within 360 days    Cholesterol, Total  Date Value Ref Range Status  04/23/2017 235 (H) 100 - 199 mg/dL Final   Cholesterol  Date Value Ref Range Status  11/25/2018 317 (H) <200 mg/dL Final         Failed - LDL in normal range and within 360 days    LDL Cholesterol (Calc)  Date Value Ref Range Status  11/25/2018 225 (H) mg/dL (calc) Final    Comment:    LDL-C levels > or = 190 mg/dL may indicate familial  hypercholesterolemia (FH). Clinical assessment and  measurement of blood lipid levels should be  considered for all first degree relatives of  patients with an FH diagnosis.  For questions about testing for familial hypercholesterolemia, please call Engineer, materials at [email or web address]. Wardell Honour, et al. J National Lipid Association  Recommendations for Patient-Centered Management of  Dyslipidemia: Part 1 Journal of Clinical Lipidology  2015;9(2), 129-169. Reference range: <100 . Desirable range <100 mg/dL for primary prevention;   <70 mg/dL for patients with CHD or diabetic patients  with > or = 2 CHD risk factors. Marland Kitchen LDL-C is now calculated using the Martin-Hopkins  calculation, which is a validated novel method providing  better accuracy than the Friedewald equation in the  estimation of LDL-C.  Horald Pollen et al. Lenox Ahr. 6160;737(10): 2061-2068  (http://education.[employer].com/faq/FAQ164)          Passed - HDL in normal range and within 360 days    HDL  Date Value Ref Range Status  11/25/2018 66 > OR = 50 mg/dL Final  62/69/4854 75 >62 mg/dL Final         Passed - Triglycerides in normal range and within 360 days    Triglycerides  Date Value Ref Range Status  11/25/2018 115 <150 mg/dL Final         Passed - Valid encounter within last 12 months    Recent Outpatient Visits          8 months ago Familial hyperlipidemia, high LDL   [facility] Alba Cory, MD   1 year ago Pleuritic chest pain   [facility] Alba Cory, MD   2 years ago Well woman exam   [facility] Alba Cory, MD   3 years ago History of Luiz Blare' disease   [facility] Alba Cory, MD   3 years ago History of Graves' disease   [facility] Alba Cory, MD

## 2019-07-03 ENCOUNTER — Ambulatory Visit (INDEPENDENT_AMBULATORY_CARE_PROVIDER_SITE_OTHER): Payer: BC Managed Care – PPO

## 2019-07-03 ENCOUNTER — Other Ambulatory Visit: Payer: Self-pay

## 2019-07-03 DIAGNOSIS — Z23 Encounter for immunization: Secondary | ICD-10-CM | POA: Diagnosis not present

## 2019-08-21 ENCOUNTER — Ambulatory Visit: Payer: Self-pay | Admitting: *Deleted

## 2019-08-21 ENCOUNTER — Emergency Department: Payer: BC Managed Care – PPO

## 2019-08-21 ENCOUNTER — Emergency Department
Admission: EM | Admit: 2019-08-21 | Discharge: 2019-08-21 | Disposition: A | Discharge status: Home / Self Care | Payer: BC Managed Care – PPO | Attending: Emergency Medicine | Admitting: Emergency Medicine

## 2019-08-21 ENCOUNTER — Encounter: Payer: Self-pay | Admitting: Emergency Medicine

## 2019-08-21 ENCOUNTER — Other Ambulatory Visit: Payer: Self-pay

## 2019-08-21 DIAGNOSIS — F419 Anxiety disorder, unspecified: Secondary | ICD-10-CM | POA: Insufficient documentation

## 2019-08-21 DIAGNOSIS — R002 Palpitations: Secondary | ICD-10-CM | POA: Diagnosis present

## 2019-08-21 DIAGNOSIS — Z79899 Other long term (current) drug therapy: Secondary | ICD-10-CM | POA: Insufficient documentation

## 2019-08-21 DIAGNOSIS — Z91040 Latex allergy status: Secondary | ICD-10-CM | POA: Insufficient documentation

## 2019-08-21 DIAGNOSIS — R519 Headache, unspecified: Secondary | ICD-10-CM | POA: Diagnosis not present

## 2019-08-21 HISTORY — DX: Thyrotoxicosis with diffuse goiter without thyrotoxic crisis or storm: E05.00

## 2019-08-21 LAB — BASIC METABOLIC PANEL
Anion gap: 7 (ref 5–15)
BUN: 13 mg/dL (ref 8–23)
CO2: 29 mmol/L (ref 22–32)
Calcium: 9.4 mg/dL (ref 8.9–10.3)
Chloride: 101 mmol/L (ref 98–111)
Creatinine, Ser: 0.72 mg/dL (ref 0.44–1.00)
GFR calc Af Amer: >60 mL/min (ref >60)
GFR calc non Af Amer: >60 mL/min (ref >60)
Glucose, Bld: 100 mg/dL — ABNORMAL HIGH (ref 70–99)
Potassium: 3.8 mmol/L (ref 3.5–5.1)
Sodium: 137 mmol/L (ref 135–145)

## 2019-08-21 LAB — CBC
HCT: 40.9 % (ref 36.0–46.0)
Hemoglobin: 14.2 g/dL (ref 12.0–15.0)
MCH: 30.4 pg (ref 26.0–34.0)
MCHC: 34.7 g/dL (ref 30.0–36.0)
MCV: 87.6 fL (ref 80.0–100.0)
Platelets: 257 10*3/uL (ref 150–400)
RBC: 4.67 MIL/uL (ref 3.87–5.11)
RDW: 13.1 % (ref 11.5–15.5)
WBC: 7.4 10*3/uL (ref 4.0–10.5)
nRBC: 0 % (ref 0.0–0.2)

## 2019-08-21 LAB — TROPONIN I (HIGH SENSITIVITY)
Troponin I (High Sensitivity): 5 ng/L (ref <18)
Troponin I (High Sensitivity): 6 ng/L (ref <18)

## 2019-08-21 LAB — T4, FREE: Free T4: 1.23 ng/dL — ABNORMAL HIGH (ref 0.61–1.12)

## 2019-08-21 LAB — TSH: TSH: 1.658 u[IU]/mL (ref 0.350–4.500)

## 2019-08-21 MED ORDER — SODIUM CHLORIDE 0.9% FLUSH: 3.0000 mL | Freq: Once | INTRAVENOUS | Status: DC

## 2019-08-21 MED ORDER — METHIMAZOLE 5 MG PO TABS: 5.0000 mg | ORAL_TABLET | Freq: Three times a day (TID) | ORAL | 1 refills | Status: DC

## 2019-08-21 MED ORDER — METOPROLOL TARTRATE 25 MG PO TABS: 25.0000 mg | ORAL_TABLET | Freq: Two times a day (BID) | ORAL | 0 refills | Status: DC

## 2019-08-21 NOTE — ED Triage Notes (Signed)
Pt presents to ED via POV with c/o palpitations, pt states felt like her heart beat was prolonged several times since yesterday. Pt states took 2 doses of Metoprolol since this morning. Pt states hx of Graves disease but does not take medications all of the time. Pt states unintentional weight loss at this time.

## 2019-08-21 NOTE — Telephone Encounter (Signed)
Pt called stating she thinks she is having a thyroid flare up; she has been having irregular heart beat for 4 days, and weight loss of 10 lbs over the past 6 weeks; when she has the heart beat irregularity, she gets light headed; it has been occurring more  frequentlyly since 1500 08/20/19; the pt says she does not take thyroid med, but she takes Metoprolol and Methimazole from Dr Carlynn Purl in 2015; the pt states she was seen by Dr Evlyn Kanner, Cardiology and was told it was anxiety; recommendations made per nurse triage protocol; she verbalized understanding but would like to be seen in the office; Dr Carlynn Purl has no availability today; the pt can be contacted at [phone]; will route to office for final disposition.  Reason for Disposition . [1] Heart beating very rapidly (e.g., > 140 / minute) AND [2] not present now  (Exception: during exercise)  Answer Assessment - Initial Assessment Questions 1. DESCRIPTION: "Please describe your heart rate or heart beat that you are having" (e.g., fast/slow, regular/irregular, skipped or extra beats, "palpitations")     irregular 2. ONSET: "When did it start?" (Minutes, hours or days)      08/17/19 3. DURATION: "How long does it last" (e.g., seconds, minutes, hours)     Seconds  4. PATTERN "Does it come and go, or has it been constant since it started?"  "Does it get worse with exertion?"   "Are you feeling it now?"    intermittent 5. TAP: "Using your hand, can you tap out what you are feeling on a chair or table in front of you, so that I can hear?" (Note: not all patients can do this)        6. HEART RATE: "Can you tell me your heart rate?" "How many beats in 15 seconds?"  (Note: not all patients can do this)      25 beats in 15 seconds 7. RECURRENT SYMPTOM: "Have you ever had this before?" If so, ask: "When was the last time?" and "What happened that time?"     yes 8. CAUSE: "What do you think is causing the palpitations?"     Thyroid problems 9. CARDIAC  HISTORY: "Do you have any history of heart disease?" (e.g., heart attack, angina, bypass surgery, angioplasty, arrhythmia)      30 years ago echo says she has valve problems 10. OTHER SYMPTOMS: "Do you have any other symptoms?" (e.g., dizziness, chest pain, sweating, difficulty breathing)     intermittent light headed when irregular heart beat 11. PREGNANCY: "Is there any chance you are pregnant?" "When was your last menstrual period?"       No, hysterectomy  Protocols used: HEART RATE AND HEARTBEAT QUESTIONS-A-AH

## 2019-08-21 NOTE — ED Provider Notes (Signed)
Emergency Department Provider Note  ____________________________________________  Time seen: Approximately 6:28 PM  I have reviewed the triage vital signs and the nursing notes.   HISTORY  Chief Complaint Palpitations   Historian Patient     HPI Mia Camacho is a 64 y.o. female presents to the emergency department with a sensation of palpitations that kept her from sleeping last night.  Patient describes her palpitations as feeling like a delayed heartbeat.  She has experienced palpitations in the past and has been seen and evaluated by cardiology and has been prescribed metoprolol.  Patient does have a history of Graves' and has needed to take metoprolol and methimazole at different times of her life.  Patient states that she has had some anxiety and occasional headache.  She has recently been under tremendous stress as she has her mother has recently passed away.  She denies current chest pain or shortness of breath.  She denies daily smoking.   Past Medical History:  Diagnosis Date   Graves disease    Hyperlipidemia      Immunizations up to date:  Yes.     Past Medical History:  Diagnosis Date   Graves disease    Hyperlipidemia     Patient Active Problem List   Diagnosis Date Noted   Myalgia due to statin 05/01/2018   GERD without esophagitis 05/01/2018   Palpitation 12/07/2015   Acute pain of right knee 11/22/2015   Perennial allergic rhinitis 09/13/2014   Back pain, chronic 09/13/2014   Diverticulosis 09/13/2014   Bursitis, trochanteric 09/13/2014   Herpes 09/13/2014   Hiatal hernia 09/13/2014   H/O: hysterectomy 09/13/2014   Personal history of urinary calculi 09/13/2014   Menopause 09/13/2014   Hernia, rectovaginal 09/13/2014   Primary osteoarthritis of both knees 09/13/2014   Pelvic muscle wasting 09/13/2014   Cleft palate 09/13/2014   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/13/2014   Epicondylitis elbow, medial 03/18/2014    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 Authorizing Provider  cholecalciferol (VITAMIN D) 1000 units tablet Take 1,000 Units by mouth as needed.     [provider]  CINNAMON PO Take 1 capsule by mouth daily. 1000 mg    [provider]  co-enzyme Q-10 50 MG capsule Take 400 mg by mouth daily.     [provider]  ezetimibe (ZETIA) 10 MG tablet TAKE 1 TABLET BY MOUTH EVERY DAY 06/21/19   Carlynn Purl, Danna Hefty, MD  meloxicam (MOBIC) 15 MG tablet TAKE 1 TABLET (15 MG TOTAL) BY MOUTH DAILY. 04/22/17   Alba Cory, MD  methimazole (TAPAZOLE) 5 MG tablet Take 1 tablet (5 mg total) by mouth 3 (three) times daily. 08/21/19 09/20/19  Orvil Feil, PA-C  metoprolol tartrate (LOPRESSOR) 25 MG tablet Take 1 tablet (25 mg total) by mouth 2 (two) times daily. 08/21/19 09/20/19  Pia Mau M, PA-C  mometasone (NASONEX) 50 MCG/ACT nasal spray PLACE 2 SPRAYS INTO THE NOSE DAILY. 07/17/18   Alba Cory, MD  Multiple Vitamin (MULTIVITAMIN) capsule Take 1 capsule by mouth daily.     [provider]  Omega-3  1000 MG CAPS Take 1,200 mg by mouth daily.     [provider]  omeprazole (PRILOSEC) 40 MG capsule TAKE 1 CAPSULE BY MOUTH EVERY DAY 08/03/18   Ancil Boozer, Drue Stager, MD  polyethylene glycol (MIRALAX / GLYCOLAX) packet Take 1 packet by mouth daily. 09/15/13   [provider]  tiZANidine (ZANAFLEX) 4 MG tablet TAKE 1 TABLET (4 MG TOTAL) BY MOUTH 3 (THREE) TIMES DAILY. Patient taking differently: Take 4 mg by mouth as needed for muscle spasms.  12/02/17   Steele Sizer, MD  vitamin E  1000 UNIT capsule Take 400 Units by mouth daily.     [provider]    Allergies Aspirin, Sulfa antibiotics, Penicillins, Statins, and Latex  Family History  Problem Relation Age of Onset   Hyperlipidemia Mother    Hypertension Mother    Diabetes Mother    Heart attack Mother 54   Cancer Father     Social History Social History   Tobacco Use   Smoking status: Never Smoker   Smokeless tobacco: Never Used  Scientific laboratory technician Use: Never used  Substance Use Topics   Alcohol use: No    Alcohol/week: 0.0 standard drinks   Drug use: No     Review of Systems  Constitutional: No fever/chills Eyes:  No discharge ENT: No upper respiratory complaints. Respiratory: no cough. No SOB/ use of accessory muscles to breath Cardiovascular: Patient has palpitations. Gastrointestinal:   No nausea, no vomiting.  No diarrhea.  No constipation. Musculoskeletal: Negative for musculoskeletal pain. Skin: Negative for rash, abrasions, lacerations, ecchymosis.    ____________________________________________   PHYSICAL EXAM:  VITAL SIGNS: ED Triage Vitals  Enc Vitals Group     BP 08/21/19 1335 130/67     Pulse Rate 08/21/19 1335 79     Resp 08/21/19 1335 18     Temp 08/21/19 1335 97.7 F (36.5 C)     Temp Source 08/21/19 1335 Oral     SpO2 08/21/19 1335 100 %     Weight 08/21/19 1332 132 lb (59.9 kg)     Height 08/21/19 1332 4\' 11"  (1.499 m)     Head Circumference --      Peak Flow --      Pain Score 08/21/19 1332 0     Pain Loc --      Pain Edu? --      Excl. in [place]? --      Constitutional: Alert and oriented. Well appearing and in no acute distress. Eyes: Conjunctivae are normal. PERRL. EOMI. Head: Atraumatic. ENT:      Nose: No congestion/rhinnorhea.      Mouth/Throat: Mucous membranes are moist.  Neck: Full range of motion. Cardiovascular: Normal rate, regular rhythm. Normal S1 and S2.  Good peripheral circulation. Respiratory: Normal respiratory  effort without tachypnea or retractions. Lungs CTAB. Good air entry to the bases with no decreased or absent breath sounds Gastrointestinal: Bowel sounds x 4 quadrants. Soft and nontender to palpation. No guarding or rigidity. No distention. Musculoskeletal: Full range of motion to all extremities. No obvious deformities noted Neurologic:  Normal for age. No gross focal neurologic deficits are appreciated.  Skin:  Skin is warm, dry and intact. No rash noted. Psychiatric: Mood and affect are normal for age. Speech and behavior are normal.   ____________________________________________   LABS (all labs ordered are listed, but only abnormal results are displayed)  Labs Reviewed  BASIC METABOLIC PANEL - Abnormal; Notable for the following components:  Result Value   Glucose, Bld 100 (*)    All other components within normal limits  T4, FREE - Abnormal; Notable for the following components:   Free T4 1.23 (*)    All other components within normal limits  CBC  TSH  TROPONIN I (HIGH SENSITIVITY)  TROPONIN I (HIGH SENSITIVITY)   ____________________________________________  EKG   ____________________________________________  RADIOLOGY Geraldo Pitter, personally viewed and evaluated these images (plain radiographs) as part of my medical decision making, as well as reviewing the written report by the radiologist.  DG Chest 2 View  Result Date: 08/21/2019 CLINICAL DATA:  Pt presents to ED via POV with c/o palpitations, pt states felt like her heart beat was prolonged several times since yesterday. Pt states took 2 doses of Metoprolol since this morning. Pt states hx of Graves disease EXAM: CHEST - 2 VIEW COMPARISON:  05/01/2018 FINDINGS: Lungs are clear. Heart size and mediastinal contours are within normal limits. No effusion.  No pneumothorax. Visualized bones unremarkable. IMPRESSION: No acute cardiopulmonary disease. Electronically Signed   By: Corlis Leak M.D.   On: 08/21/2019  14:34    ____________________________________________    PROCEDURES  Procedure(s) performed:     Procedures     Medications  sodium chloride flush (NS) 0.9 % injection 3 mL (3 mLs Intravenous Not Given 08/21/19 1823)     ____________________________________________   INITIAL IMPRESSION / ASSESSMENT AND PLAN / ED COURSE  Pertinent labs & imaging results that were available during my care of the patient were reviewed by me and considered in my medical decision making (see chart for details).      Assessment and plan Palpitations 64 year old female presents to the emergency department with a sensation of palpitations that started last night.  Vital signs were reassuring at triage.  On physical exam, patient was resting in a supine position comfortably with no increased work of breathing or apparent discomfort.  EKG revealed normal sinus rhythm without apparent arrhythmia or ST segment elevation.  Both sets of troponin were within reference range.  Free T4 was elevated with normal TSH.  Chest x-ray revealed no evidence of pneumothorax.  We will start patient back on her methimazole and metoprolol.  Patient has a follow-up appointment with her primary care provider on Monday.  Advised patient to keep her follow-up appointment.  Return precautions were given to return with new or worsening symptoms.    ____________________________________________  FINAL CLINICAL IMPRESSION(S) / ED DIAGNOSES  Final diagnoses:  Palpitations      NEW MEDICATIONS STARTED DURING THIS VISIT:  ED Discharge Orders         Ordered    metoprolol tartrate (LOPRESSOR) 25 MG tablet  2 times daily     Discontinue  Reprint     08/21/19 1812    methimazole (TAPAZOLE) 5 MG tablet  3 times daily     Discontinue  Reprint     08/21/19 1812              This chart was dictated using voice recognition software/Dragon. Despite best efforts to proofread, errors can occur which can change the  meaning. Any change was purely unintentional.     Orvil Feil, PA-C 08/21/19 1836    Concha Se, MD 08/21/19 [phone]

## 2019-08-21 NOTE — Discharge Instructions (Addendum)
Please start taking Methimazole and Metoprolol for palpitations. Please keep appointment with primary care. Return to the emergency department with new or worsening symptoms.

## 2019-08-24 ENCOUNTER — Ambulatory Visit: Payer: BC Managed Care – PPO | Admitting: Family Medicine

## 2019-08-24 ENCOUNTER — Other Ambulatory Visit: Payer: Self-pay

## 2019-08-24 ENCOUNTER — Encounter: Payer: Self-pay | Admitting: Family Medicine

## 2019-08-24 VITALS — BP 108/72 | HR 86 | Temp 98.1°F | Resp 16 | Ht 59.0 in | Wt 133.5 lb

## 2019-08-24 DIAGNOSIS — Z23 Encounter for immunization: Secondary | ICD-10-CM

## 2019-08-24 DIAGNOSIS — E0500 Thyrotoxicosis with diffuse goiter without thyrotoxic crisis or storm: Secondary | ICD-10-CM | POA: Diagnosis not present

## 2019-08-24 DIAGNOSIS — Z79899 Other long term (current) drug therapy: Secondary | ICD-10-CM | POA: Diagnosis not present

## 2019-08-24 DIAGNOSIS — R002 Palpitations: Secondary | ICD-10-CM | POA: Diagnosis not present

## 2019-08-24 NOTE — Progress Notes (Signed)
Name: Mia Camacho   MRN: [id number]    DOB: November 27, 1955   Date:08/24/2019       Progress Note  Subjective  Chief Complaint  No chief complaint on file.   HPI  History of Graves Disease: over the past 2 weeks she noticed some palpitation and lightheaded that was intermittent and very brief in duration but since last 06-22-2022 she had more consistent palpitation, fatigue. 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 She had and normal CXR  negative troponin levels, normal kidney function, glucose borderline, normal CBC. TSH was normal Free T4 slightly elevated. Her BP was normal , heart rate 79 on arrival, weight 132 lbs. Prior to going to seek care she had taken two doses of metoprolol that she had at home . EC provider sent her home with Methimazole and Metoprolol and instructions to follow up with me.    Patient Active Problem List   Diagnosis Date Noted  . Myalgia due to statin 05/01/2018  . GERD without esophagitis 05/01/2018  . Palpitation 12/07/2015  . Acute pain of right knee 11/22/2015  . Perennial allergic rhinitis 09/13/2014  . Back pain, chronic 09/13/2014  . Diverticulosis 09/13/2014  . Bursitis, trochanteric 09/13/2014  . Herpes 09/13/2014  . Hiatal hernia 09/13/2014  . H/O: hysterectomy 09/13/2014  . Personal history of urinary calculi 09/13/2014  . Menopause 09/13/2014  . Hernia, rectovaginal 09/13/2014  . Primary osteoarthritis of both knees 09/13/2014  . Pelvic muscle wasting 09/13/2014  . Cleft palate 09/13/2014  . 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/13/2014  . Epicondylitis elbow, medial 03/18/2014  . 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 Gastroesophageal reflux disease with hiatal hernia 06/28/2009  . Vitamin D deficiency 04/05/2009  . Familial  hyperlipidemia, high LDL 02/10/2009  . History of Graves' disease 02/10/2009    Past Surgical History:  Procedure Laterality Date  . ABDOMINAL HYSTERECTOMY    . bladder tact    . ROTATOR CUFF REPAIR      Family History  Problem Relation Age of Onset  . Hyperlipidemia Mother   . Hypertension Mother   . Diabetes Mother   . Heart attack Mother 34  . Cancer Father     Social History   Tobacco Use  . Smoking status: Never Smoker  . Smokeless tobacco: Never Used  Substance Use Topics  . Alcohol use: No    Alcohol/week: 0.0 standard drinks     Current Outpatient Medications:  .  cholecalciferol (VITAMIN D) 1000 units tablet, Take 1,000 Units by mouth as needed. , Disp: , Rfl:  .  CINNAMON PO, Take 1 capsule by mouth daily. 1000 mg, Disp: , Rfl:  .  co-enzyme Q-10 50 MG capsule, Take 400 mg by mouth daily. , Disp: , Rfl:  .  ezetimibe (ZETIA) 10 MG tablet, TAKE 1 TABLET BY MOUTH EVERY DAY, Disp: 90 tablet, Rfl: 1 .  meloxicam (MOBIC) 15 MG tablet, TAKE 1 TABLET (15 MG TOTAL) BY MOUTH DAILY., Disp: 90 tablet, Rfl: 0 .  methimazole (TAPAZOLE) 5 MG tablet, Take 1 tablet (5 mg total) by mouth 3 (three) times daily., Disp: 90 tablet, Rfl: 1 .  metoprolol tartrate (LOPRESSOR) 25 MG tablet, Take 1 tablet (25 mg total) by mouth 2 (two) times daily.,  Disp: 60 tablet, Rfl: 0 .  mometasone (NASONEX) 50 MCG/ACT nasal spray, PLACE 2 SPRAYS INTO THE NOSE DAILY., Disp: 17 g, Rfl: 6 .  Multiple Vitamin (MULTIVITAMIN) capsule, Take 1 capsule by mouth daily. , Disp: , Rfl:  .  Omega-3 1000 MG CAPS, Take 1,200 mg by mouth daily. , Disp: , Rfl:  .  omeprazole (PRILOSEC) 40 MG capsule, TAKE 1 CAPSULE BY MOUTH EVERY DAY, Disp: 90 capsule, Rfl: 0 .  polyethylene glycol (MIRALAX / GLYCOLAX) packet, Take 1 packet by mouth daily., Disp: , Rfl:  .  tiZANidine (ZANAFLEX) 4 MG tablet, TAKE 1 TABLET (4 MG TOTAL) BY MOUTH 3 (THREE) TIMES DAILY. (Patient taking differently: Take 4 mg by mouth as needed for  muscle spasms. ), Disp: 90 tablet, Rfl: 0 .  vitamin E 1000 UNIT capsule, Take 400 Units by mouth daily. , Disp: , Rfl:   Allergies  Allergen Reactions  . Aspirin Rash  . Sulfa Antibiotics Rash  . Penicillins Nausea And Vomiting  . Statins     Muscle pain  . Latex Rash    Other Reaction: WELTS    I personally reviewed active problem list, medication list, allergies, family history, social history, health maintenance with the patient/caregiver today.   ROS  Constitutional: Negative for fever or weight change.  Respiratory: Negative for cough and shortness of breath.   Cardiovascular: Negative for chest pain , positive for  palpitations.  Gastrointestinal: Negative for abdominal pain, no bowel changes.  Musculoskeletal: Negative for gait problem or joint swelling.  Skin: Negative for rash.  Neurological: positive  for intermittent dizziness but no  headache.  No other specific complaints in a complete review of systems (except as listed in HPI above).  Objective  Vitals:   08/24/19 0750  Pulse: 86  Resp: 16  Temp: 98.1 F (36.7 C)  TempSrc: Temporal  SpO2: 99%  Weight: 133 lb 8 oz (60.6 kg)  Height: 4\' 11"  (1.499 m)    Body mass index is 26.96 kg/m.  Physical Exam  Constitutional: Patient appears well-developed and well-nourished. Overweight.  No distress.  HEENT: head atraumatic, normocephalic, pupils equal and reactive to light Cardiovascular: Normal rate, regular rhythm and normal heart sounds.  No murmur heard. No BLE edema. Pulmonary/Chest: Effort normal and breath sounds normal. No respiratory distress. Abdominal: Soft.  There is no tenderness. Psychiatric: Patient has a normal mood and affect. behavior is normal. Judgment and thought content normal.  Recent Results (from the past 2160 hour(s))  Basic metabolic panel     Status: Abnormal   Collection Time: 08/21/19  1:34 PM  Result Value Ref Range   Sodium 137 135 - 145 mmol/L   Potassium 3.8 3.5 - 5.1  mmol/L   Chloride 101 98 - 111 mmol/L   CO2 29 22 - 32 mmol/L   Glucose, Bld 100 (H) 70 - 99 mg/dL    Comment: Glucose reference range applies only to samples taken after fasting for at least 8 hours.   BUN 13 8 - 23 mg/dL   Creatinine, Ser 08/23/19 0.44 - 1.00 mg/dL   Calcium 9.4 8.9 - 2.26 mg/dL   GFR calc non Af Amer >60 >60 mL/min   GFR calc Af Amer >60 >60 mL/min   Anion gap 7 5 - 15    Comment: Performed at [facility], [address] [place], [place]  CBC     Status: None   Collection Time: 08/21/19  1:34 PM  Result Value  Ref Range   WBC 7.4 4.0 - 10.5 K/uL   RBC 4.67 3.87 - 5.11 MIL/uL   Hemoglobin 14.2 12.0 - 15.0 g/dL   HCT 40.0 36 - 46 %   MCV 87.6 80.0 - 100.0 fL   MCH 30.4 26.0 - 34.0 pg   MCHC 34.7 30.0 - 36.0 g/dL   RDW 86.7 61.9 - 50.9 %   Platelets 257 150 - 400 K/uL   nRBC 0.0 0.0 - 0.2 %    Comment: Performed at [facility], [address] [place], [place] [address]  Troponin I (High Sensitivity)     Status: None   Collection Time: 08/21/19  1:34 PM  Result Value Ref Range   Troponin I (High Sensitivity) 5 <18 ng/L    Comment: (NOTE) Elevated high sensitivity troponin I (hsTnI) values and significant  changes across serial measurements may suggest ACS but many other  chronic and acute conditions are known to elevate hsTnI results.  Refer to the "Links" section for chest pain algorithms and additional  guidance. Performed at [facility], [address] [place], [place] [address]   TSH     Status: None   Collection Time: 08/21/19  1:40 PM  Result Value Ref Range   TSH 1.658 0.350 - 4.500 uIU/mL    Comment: Performed by a 3rd Generation assay with a functional sensitivity of <=0.01 uIU/mL. Performed at [facility], [address] [place], [place] [address]   T4, free     Status: Abnormal   Collection Time: 08/21/19  1:40 PM  Result Value Ref Range   Free T4 1.23 (H) 0.61 - 1.12 ng/dL    Comment:  (NOTE) Biotin ingestion may interfere with free T4 tests. If the results are inconsistent with the TSH level, previous test results, or the clinical presentation, then consider biotin interference. If needed, order repeat testing after stopping biotin. Performed at [facility], [address] [place], [place] [address]   Troponin I (High Sensitivity)     Status: None   Collection Time: 08/21/19  5:22 PM  Result Value Ref Range   Troponin I (High Sensitivity) 6 <18 ng/L    Comment: (NOTE) Elevated high sensitivity troponin I (hsTnI) values and significant  changes across serial measurements may suggest ACS but many other  chronic and acute conditions are known to elevate hsTnI results.  Refer to the "Links" section for chest pain algorithms and additional  guidance. Performed at [facility], [address] [place], [place] [address]       PHQ2/9: Depression screen [facility] 2/9 10/21/2018 05/01/2018 04/22/2017 05/25/2016 11/25/2015  Decreased Interest 0 0 0 0 0  Down, Depressed, Hopeless 0 0 0 0 0  PHQ - 2 Score 0 0 0 0 0  Altered sleeping 0 0 - - -  Tired, decreased energy 0 0 - - -  Change in appetite 0 0 - - -  Feeling bad or failure about yourself  0 0 - - -  Trouble concentrating 0 0 - - -  Moving slowly or fidgety/restless 0 0 - - -  Suicidal thoughts 0 0 - - -  PHQ-9 Score 0 0 - - -  Difficult doing work/chores - Not difficult at all - - -    phq 9 is negative   Fall Risk: Fall Risk  10/21/2018 05/01/2018 04/22/2017 05/25/2016 11/25/2015  Falls in the past year? 0 1 Yes No No  Comment - July 2019 - - -  Number falls in past yr: 0 0 1 - -  Comment - tripped at a gas station - - -  Injury with Fall? 0 1 Yes - -  Comment - contusions on her buttock - - -  Risk for fall due to : - - History of fall(s) - -  Follow up - - Education provided - -     Assessment & Plan  1. Palpitation  Doing well on Metoprolol   2. Graves disease  - Ambulatory  referral to Endocrinology - hepatic function test   3. Need for Tdap vaccination  - Tdap vaccine greater than or equal to 7yo IM

## 2019-08-25 LAB — HEPATIC FUNCTION PANEL
AG Ratio: 2 (calc) (ref 1.0–2.5)
ALT: 27 U/L (ref 6–29)
AST: 25 U/L (ref 10–35)
Albumin: 4.3 g/dL (ref 3.6–5.1)
Alkaline phosphatase (APISO): 62 U/L (ref 37–153)
Bilirubin, Direct: 0.1 mg/dL (ref 0.0–0.2)
Globulin: 2.2 g/dL (calc) (ref 1.9–3.7)
Indirect Bilirubin: 0.2 mg/dL (calc) (ref 0.2–1.2)
Total Bilirubin: 0.3 mg/dL (ref 0.2–1.2)
Total Protein: 6.5 g/dL (ref 6.1–8.1)

## 2019-09-14 NOTE — Progress Notes (Signed)
Patient ID: Mia Nippereresa L Reesor, female    DOB: [birth year], 64 y.o.   MRN: [id number]  PCP: Alba CorySowles, Krichna, MD  Chief Complaint  Patient presents with  . Graves' Disease    discuss medication, has endocrine appt 10/12/19    Subjective:   Mia Camacho is a 64 y.o. female, presents to clinic with CC of the following:  Chief Complaint  Patient presents with  . Graves' Disease    discuss medication, has endocrine appt 10/12/19   Patient came 5 minutes late and was seen.  HPI:  Patient is a 64 year old female patient of Dr. Carlynn PurlSowles She was seen in the emergency department 08/21/2019 for palpitations with the following summary:             64 year old female presents to the emergency department with a sensation of palpitations that started last night.                         Vital signs were reassuring at triage. On physical exam, patient was resting in a supine position comfortably with no increased work of breathing or apparent discomfort.             EKG revealed normal sinus rhythm without apparent arrhythmia or ST segment elevation. Both sets of troponin were within reference range. Free T4 was elevated with  normal TSH. Chest x-ray revealed no evidence of pneumothorax.             We will start patient back on her methimazole and metoprolol. Patient has a follow-up appointment with her primary care provider on Monday. Advised patient to keep her follow-up appointment. Return precautions were given to return with new or worsening symptoms  She had a follow-up with Dr. Carlynn PurlSowles the Monday after.  On that visit she noted she was doing well on the metoprolol, and an endocrinology referral was provided with her Graves' disease history.  She has an appointment scheduled on August 9 with endocrine.  Liver function tests done at that visit were normal.  She follows up today as noted not feeling as well on the medications resumed in the emergency room.  She states a few days after seeing  Dr. Carlynn PurlSowles on follow-up, she felt awful, extremely fatigued, struggled to try to do much of anything, just "not have the energy to move ".  With that continuing, 3 days ago she halfed her metoprolol dose and stopped taking the methimazole product.  She notes she "does not feel horrible now "since that change.  She does note that a couple hours before she is due to take her metoprolol, she can feel that funny sensation in her heart as she described, with it going faster, and describes as more of a prolonged heartbeat.  Not severe chest pains, sometimes can feel a little short of breath with her symptoms.  Denied any recent fevers, no abdominal pains, no flank pains, no increased lower extremity swelling.  She notes her history of Graves' disease is as follows: was diagnosed in 2005, had imaging as part of the work-up, and took medications for a while, then was able to stop.  She did well till about 2010, then had a flare and resume the medications for a period of time, before stopping again.  She had been off of them until this start up again recommended by the emergency room.  She noted she did see cardiology in the past, it was in very late  2015-07-26, with a work-up including a stress echo, and a monitor.  There were no concerns raised from that evaluation, and she noted the cardiologist thought there may be some anxiety potentially contributing.  The note from December 2017 was reviewed.  She was given a longer acting metoprolol product at that time to use more as needed.  She notes that she was caring for her mother before she passed away in 07-26-2022 with a lot of stresses related to that.  Still has some occasional anxiety, some of which is physiologic as when she gets these symptoms as she describes, that heart sensation and sometimes shortness of breath with it, can increase the anxiety levels due to concern.  He has not been on another medicine for anxiety previously, nor does she think 1 is needed  presently.   Lab Results  Component Value Date   TSH 1.658 08/21/2019   T4TOTAL 7.6 05/25/2016  Free T4 was 1.23 on 08/21/19 (normal .61-1.12)   Patient Active Problem List   Diagnosis Date Noted  . Myalgia due to statin 05/01/2018  . GERD without esophagitis 05/01/2018  . Palpitation 12/07/2015  . Perennial allergic rhinitis 09/13/2014  . Back pain, chronic 09/13/2014  . Diverticulosis 09/13/2014  . Bursitis, trochanteric 09/13/2014  . Herpes 09/13/2014  . Hiatal hernia 09/13/2014  . H/O: hysterectomy 09/13/2014  . Personal history of urinary calculi 09/13/2014  . Menopause 09/13/2014  . Hernia, rectovaginal 09/13/2014  . Primary osteoarthritis of both knees 09/13/2014  . Pelvic muscle wasting 09/13/2014  . Cleft palate 09/13/2014  . 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/13/2014  . Epicondylitis elbow, medial 03/18/2014  . 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 Gastroesophageal reflux disease with hiatal hernia 06/28/2009  . Vitamin D deficiency 04/05/2009  . Familial hyperlipidemia, high LDL 02/10/2009  . History of Graves' disease 02/10/2009      Current Outpatient Medications:  .  metoprolol tartrate (LOPRESSOR) 25 MG tablet, Take 1 tablet (25 mg total) by mouth 2 (two) times daily., Disp: 60 tablet, Rfl: 0 .  cholecalciferol (VITAMIN D) 1000 units tablet, Take 1,000 Units by mouth as needed.  (Patient not taking: Reported on 09/15/2019), Disp: , Rfl:  .  CINNAMON PO, Take 1 capsule by mouth daily. 1000 mg (Patient not taking: Reported on 09/15/2019), Disp: , Rfl:  .  co-enzyme Q-10 50 MG capsule, Take 400 mg by mouth daily.  (Patient not taking: Reported on 09/15/2019), Disp: , Rfl:  .  ezetimibe (ZETIA) 10 MG tablet, TAKE 1 TABLET BY MOUTH EVERY DAY (Patient not taking: Reported on 09/15/2019), Disp: 90 tablet, Rfl: 1 .  meloxicam (MOBIC) 15 MG tablet, TAKE 1 TABLET (15 MG TOTAL) BY MOUTH DAILY. (Patient not taking: Reported on 09/15/2019), Disp: 90 tablet, Rfl: 0 .  methimazole  (TAPAZOLE) 5 MG tablet, Take 1 tablet (5 mg total) by mouth 3 (three) times daily. (Patient not taking: Reported on 09/15/2019), Disp: 90 tablet, Rfl: 1 .  mometasone (NASONEX) 50 MCG/ACT nasal spray, PLACE 2 SPRAYS INTO THE NOSE DAILY. (Patient not taking: Reported on 09/15/2019), Disp: 17 g, Rfl: 6 .  Multiple Vitamin (MULTIVITAMIN) capsule, Take 1 capsule by mouth daily.  (Patient not taking: Reported on 09/15/2019), Disp: , Rfl:  .  Omega-3 1000 MG CAPS, Take 1,200 mg by mouth daily.  (Patient not taking: Reported on 09/15/2019), Disp: , Rfl:  .  omeprazole (PRILOSEC) 40 MG capsule, TAKE 1 CAPSULE BY MOUTH EVERY DAY (Patient not taking: Reported on 09/15/2019), Disp: 90 capsule, Rfl: 0 .  polyethylene glycol (MIRALAX / GLYCOLAX) packet, Take 1 packet by mouth daily. (Patient not taking: Reported on 09/15/2019), Disp: , Rfl:  .  tiZANidine (ZANAFLEX) 4 MG tablet, TAKE 1 TABLET (4 MG TOTAL) BY MOUTH 3 (THREE) TIMES DAILY. (Patient not taking: Reported on 09/15/2019), Disp: 90 tablet, Rfl: 0 .  vitamin E 1000 UNIT capsule, Take 400 Units by mouth daily.  (Patient not taking: Reported on 09/15/2019), Disp: , Rfl:    Allergies  Allergen Reactions  . Aspirin Rash  . Sulfa Antibiotics Rash  . Penicillins Nausea And Vomiting  . Statins     Muscle pain  . Latex Rash    Other Reaction: WELTS     Past Surgical History:  Procedure Laterality Date  . ABDOMINAL HYSTERECTOMY    . bladder tact    . ROTATOR CUFF REPAIR       Family History  Problem Relation Age of Onset  . Hyperlipidemia Mother   . Hypertension Mother   . Diabetes Mother   . Heart attack Mother 23  . Heart failure Mother   . Cancer Father      Social History   Tobacco Use  . Smoking status: Never Smoker  . Smokeless tobacco: Never Used  Substance Use Topics  . Alcohol use: No    Alcohol/week: 0.0 standard drinks   With staff assistance, above reviewed with the patient today.  ROS: As per HPI, otherwise no specific  complaints on a limited and focused system review   No results found for this or any previous visit (from the past 72 hour(s)).   PHQ2/9: Depression screen [facility] 2/9 09/15/2019 08/24/2019 10/21/2018 05/01/2018 04/22/2017  Decreased Interest 0 0 0 0 0  Down, Depressed, Hopeless 0 0 0 0 0  PHQ - 2 Score 0 0 0 0 0  Altered sleeping 0 0 0 0 -  Tired, decreased energy 0 1 0 0 -  Change in appetite 0 0 0 0 -  Feeling bad or failure about yourself  0 0 0 0 -  Trouble concentrating 0 0 0 0 -  Moving slowly or fidgety/restless 0 0 0 0 -  Suicidal thoughts 0 0 0 0 -  PHQ-9 Score 0 1 0 0 -  Difficult doing work/chores Not difficult at all Not difficult at all - Not difficult at all -   PHQ-2/9 Result is neg  Fall Risk: Fall Risk  09/15/2019 08/24/2019 10/21/2018 05/01/2018 04/22/2017  Falls in the past year? 0 0 0 1 Yes  Comment - - - July 2019 -  Number falls in past yr: - 0 0 0 1  Comment - - - tripped at a gas station -  Injury with Fall? - 0 0 1 Yes  Comment - - - contusions on her buttock -  Risk for fall due to : - - - - History of fall(s)  Follow up - - - - Education provided      Objective:   Vitals:   09/15/19 0853  BP: 116/72  Pulse: 85  Resp: 16  Temp: 97.8 F (36.6 C)  TempSrc: Temporal  SpO2: 100%  Weight: 134 lb 12.8 oz (61.1 kg)  Height: 4\' 11"  (1.499 m)    Body mass index is 27.23 kg/m.  Physical Exam   NAD, masked, very pleasant, looks well, not anxious appearing HEENT - [place]/AT, sclera anicteric, PERRL, EOMI, conj - non-inj'ed,  pharynx clear Neck - supple, no adenopathy, no TM, carotids 2+ and = without bruits bilat  Car - RRR, no murmur gallop or rubs. Pulm- RR and effort normal at rest, CTA without wheeze or rales Abd - soft, NT diffusely, ND, Back - no CVA tenderness Ext - no LE edema,  Neuro/psychiatric - affect was not flat, appropriate with conversation  Alert and oriented  Deep tendon reflexes were 2+ and equal in the patella and biceps, no resting  tremor  Grossly non-focal - good strength on testing extremities, including good interosseous muscle strength of the hands, sensation intact to LT in distal extremities  Speech normal   Results for orders placed or performed in visit on 08/24/19  Hepatic function panel  Result Value Ref Range   Total Protein 6.5 6.1 - 8.1 g/dL   Albumin 4.3 3.6 - 5.1 g/dL   Globulin 2.2 1.9 - 3.7 g/dL (calc)   AG Ratio 2.0 1.0 - 2.5 (calc)   Total Bilirubin 0.3 0.2 - 1.2 mg/dL   Bilirubin, Direct 0.1 0.0 - 0.2 mg/dL   Indirect Bilirubin 0.2 0.2 - 1.2 mg/dL (calc)   Alkaline phosphatase (APISO) 62 37 - 153 U/L   AST 25 10 - 35 U/L   ALT 27 6 - 29 U/L   Reviewed labs from 08/21/2019 which included a normal CBC, BMP with just a mildly elevated glucose at 100 and otherwise normal, troponins negative, and a normal TSH with the slightly elevated free T4.    Assessment & Plan:    1. Palpitations Patient notes that this "funny feeling in her chest "she has been experiencing again in the recent past since taking half of the metoprolol dose occurs a couple hours before she is due for her next metoprolol dose.  She has no more chest pain concerns, no concerning associated symptoms except at times some feeling a little short of breath when the symptoms occur.  Reassured reviewing her work-up in the emergency room in the very recent past which included the ECG, negative troponins, and also her cardiology work-up noted in 2017. Vital signs today were good and no marked concerns from her exam today. Felt best to return to the 25 mg dose of metoprolol twice daily (not half the dose), with the importance of ensuring she is taking the medicine at 12-hour intervals.   2. History of Graves' disease/recent marked fatigue She does have a Graves' disease history, with medications to manage a couple times noted in the past.  Her free T4 was just slightly out of range when checked.  She resumed methimazole 3 times a day, with  marked fatigue and decreased energy levels resulting, raising concerns for lowering her thyroid levels too significantly.  She has felt better in that realm since stopping the methimazole. Clinically, do not feel she is markedly hyperthyroid or hypothyroid on evaluation today. We will continue off of the methimazole presently noting the slight elevation in free T4 and a normal TSH and the clinical scenario noted above. Discussed potentially rechecking labs again today, although noted the usual time needed to help assess effects of medications and the effects on the thyroid status.  She has an appointment scheduled with endocrine in about 3 weeks, and will await that visit and assessment for potential recheck as well as other labs that may be needed. Did note other entities in the differential outside of thyroid disease that could cause her symptoms, and reassured with the emergency room evaluation and follow-up assessments.  3. Anxiety May have some element of anxiety contributing, some of which can be physiologic as we  discussed today. She did not feel further medication for anxiety was needed today and I agree.  She will keep the appointment with endocrine as scheduled, and should follow-up here sooner if symptoms more concerning, other issues arising.  She was in agreement with the above plan.    Jamelle Haring, MD 09/15/19 8:58 AM

## 2019-09-15 ENCOUNTER — Ambulatory Visit: Payer: BC Managed Care – PPO | Admitting: Internal Medicine

## 2019-09-15 ENCOUNTER — Encounter: Payer: Self-pay | Admitting: Internal Medicine

## 2019-09-15 ENCOUNTER — Other Ambulatory Visit: Payer: Self-pay

## 2019-09-15 VITALS — BP 116/72 | HR 85 | Temp 97.8°F | Resp 16 | Ht 59.0 in | Wt 134.8 lb

## 2019-09-15 DIAGNOSIS — Z8639 Personal history of other endocrine, nutritional and metabolic disease: Secondary | ICD-10-CM

## 2019-09-15 DIAGNOSIS — R002 Palpitations: Secondary | ICD-10-CM

## 2019-09-15 DIAGNOSIS — F419 Anxiety disorder, unspecified: Secondary | ICD-10-CM | POA: Diagnosis not present

## 2019-09-15 NOTE — Patient Instructions (Signed)
Continue off of the methimazole presently.  Continue taking the metoprolol product twice daily, trying to take about 12 hours apart  Keep your follow-up with endocrine as scheduled

## 2019-09-18 ENCOUNTER — Other Ambulatory Visit: Payer: Self-pay | Admitting: Family Medicine

## 2019-09-18 MED ORDER — METOPROLOL TARTRATE 25 MG PO TABS: 25.0000 mg | ORAL_TABLET | Freq: Two times a day (BID) | ORAL | 0 refills | Status: DC

## 2019-09-18 NOTE — Telephone Encounter (Signed)
Copied from CRM [id number]. Topic: Quick Communication - Rx Refill/Question >> Sep 18, 2019 10:07 AM Marylen Ponto wrote: Medication: metoprolol tartrate (LOPRESSOR) 25 MG tablet  Has the patient contacted their pharmacy? no  Preferred Pharmacy (with phone number or street name): [id number] - [place], [place] - [address]  Phone: [phone]  Fax: [phone]  Agent: Please be advised that RX refills may take up to 3 business days. We ask that you follow-up with your pharmacy.

## 2019-10-12 ENCOUNTER — Other Ambulatory Visit: Payer: Self-pay | Admitting: Family Medicine

## 2019-12-16 ENCOUNTER — Other Ambulatory Visit: Payer: Self-pay | Admitting: Family Medicine

## 2019-12-16 DIAGNOSIS — E2839 Other primary ovarian failure: Secondary | ICD-10-CM

## 2020-01-07 ENCOUNTER — Other Ambulatory Visit: Payer: Self-pay | Admitting: Family Medicine

## 2020-01-08 ENCOUNTER — Telehealth: Payer: Self-pay | Admitting: Family Medicine

## 2020-01-08 DIAGNOSIS — E7849 Other hyperlipidemia: Secondary | ICD-10-CM

## 2020-01-08 NOTE — Telephone Encounter (Signed)
Requested medications are due for refill today yes  Requested medications are on the active medication list yes  Last refill 8/12  Last visit 11/2018  Notes to clinic Failed protocol of visit and labs within 6 months

## 2020-01-11 NOTE — Telephone Encounter (Signed)
appt scheduled for 2.1.2021

## 2020-01-15 ENCOUNTER — Other Ambulatory Visit: Payer: Self-pay | Admitting: Family Medicine

## 2020-01-15 DIAGNOSIS — J3089 Other allergic rhinitis: Secondary | ICD-10-CM

## 2020-01-15 NOTE — Telephone Encounter (Signed)
Requested medication (s) are due for refill today:  Yes  Requested medication (s) are on the active medication list:  Yes  Future visit scheduled:  Yes  Last Refill: 07/17/2018; 17 g/ RF x 6  Notes to clinic: Pharmacy is requesting diagnosis code.  This problem has not been documented since 07/2018.  Please advise on refill and diagnosis code.     Requested Prescriptions  Pending Prescriptions Disp Refills   mometasone (NASONEX) 50 MCG/ACT nasal spray [Pharmacy Med Name: MOMETASONE FUROATE 50 MCG SPRY] 17 each 6    Sig: PLACE 2 SPRAYS INTO THE NOSE DAILY.      Ear, Nose, and Throat: Nasal Preparations - Corticosteroids Passed - 01/15/2020  3:19 PM      Passed - Valid encounter within last 12 months    Recent Outpatient Visits           4 months ago Palpitations   [facility] Jamelle Haring, MD   4 months ago Palpitation   [facility] Alba Cory, MD   1 year ago Familial hyperlipidemia, high LDL   [facility] Alba Cory, MD   1 year ago Pleuritic chest pain   [facility] Alba Cory, MD   2 years ago Well woman exam   [facility] Alba Cory, MD       Future Appointments             In 2 months Alba Cory, MD [facility], [facility]

## 2020-01-18 ENCOUNTER — Other Ambulatory Visit: Payer: Self-pay

## 2020-01-21 ENCOUNTER — Other Ambulatory Visit: Payer: Self-pay | Admitting: Family Medicine

## 2020-01-21 DIAGNOSIS — J3089 Other allergic rhinitis: Secondary | ICD-10-CM

## 2020-01-21 MED ORDER — MOMETASONE FUROATE 50 MCG/ACT NA SUSP: 2.0000 | Freq: Every day | NASAL | 0 refills | Status: DC

## 2020-01-21 NOTE — Telephone Encounter (Signed)
PT need a refill  mometasone (NASONEX) 50 MCG/ACT nasal spray [[id number]]  [id number] - [place], [place] - [address]  [address] [place] [address]  Phone: [phone] Fax: [phone]

## 2020-03-30 ENCOUNTER — Other Ambulatory Visit: Payer: Self-pay | Admitting: Family Medicine

## 2020-03-30 NOTE — Telephone Encounter (Signed)
Courtesy refill. Pt has appt. 04/05/20 

## 2020-04-04 NOTE — Progress Notes (Signed)
Name: Mia Camacho   MRN: [id number]    DOB: 1956/01/05   Date:04/05/2020       Progress Note  Subjective  Chief Complaint  Medication Refill  HPI  History of Graves Disease: seen last year by Endo, off methimazole , taking metoprolol only prn now, palpitation under control   Familial hyperlipidemia: she is taking Zetia, we will recheck labs today. She cannot tolerate statin therapy  Perennial AR: she has been using nasal steroids to control nasal congestion, sometimes has rhinorrhea, worse symptoms is sneezing   Vitamin D deficiency: she will resume vitamin D  supplementation   Anxiety: she was having palpitations in 2021. Lost her mother April 2021 - she was her caregiver after she had an MI and multiple blocks - they cared for her for about 14 months. Daughter and son-in-law moved in with them in March 2021. She states her husband and herself retired at the same time September 2021. She was very stressed last year, lots of transitions, seen by Endo and told it was likely from anxiety. She states she is feeling better now   Chronic back pain : she has daily lower back pain, described as aching and radiates to sides, worse when she first gets up in am and improves with activity . She has some PT exercises at home. No bowel or bladder incontinence    Patient Active Problem List   Diagnosis Date Noted  . Anxiety 09/15/2019  . Myalgia due to statin 05/01/2018  . GERD without esophagitis 05/01/2018  . Palpitations 12/07/2015  . Perennial allergic rhinitis 09/13/2014  . Back pain, chronic 09/13/2014  . Diverticulosis 09/13/2014  . Bursitis, trochanteric 09/13/2014  . Herpes 09/13/2014  . Hiatal hernia 09/13/2014  . H/O: hysterectomy 09/13/2014  . Personal history of urinary calculi 09/13/2014  . Menopause 09/13/2014  . Hernia, rectovaginal 09/13/2014  . Primary osteoarthritis of both knees 09/13/2014  . Pelvic muscle wasting 09/13/2014  . Cleft palate 09/13/2014  . Perforated  tympanic membrane 09/13/2014  . Epicondylitis elbow, medial 03/18/2014  . 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 Gastroesophageal reflux disease with hiatal hernia 06/28/2009  . Vitamin D deficiency 04/05/2009  . Familial hyperlipidemia, high LDL 02/10/2009  . History of Graves' disease 02/10/2009    Past Surgical History:  Procedure Laterality Date  . ABDOMINAL HYSTERECTOMY    . bladder tact    . ROTATOR CUFF REPAIR      Family History  Problem Relation Age of Onset  . Hyperlipidemia Mother   . Hypertension Mother   . Diabetes Mother   . Heart attack Mother 37  . Heart failure Mother   . Cancer Father     Social History   Tobacco Use  . Smoking status: Never Smoker  . Smokeless tobacco: Never Used  Substance Use Topics  . Alcohol use: No    Alcohol/week: 0.0 standard drinks     Current Outpatient Medications:  .  cholecalciferol (VITAMIN D) 1000 units tablet, Take 1,000 Units by mouth as needed., Disp: , Rfl:  .  metoprolol tartrate (LOPRESSOR) 25 MG tablet, TAKE 1 TABLET BY MOUTH TWICE A DAY, Disp: 180 tablet, Rfl: 0 .  Multiple Vitamin (MULTIVITAMIN) capsule, Take 1 capsule by mouth daily., Disp: , Rfl:  .  omeprazole (PRILOSEC) 40 MG capsule, TAKE 1 CAPSULE BY MOUTH EVERY DAY, Disp: 90 capsule, Rfl: 0 .  polyethylene glycol (MIRALAX / GLYCOLAX) packet, Take 1 packet by mouth daily., Disp: , Rfl:  .  tiZANidine (ZANAFLEX)  4 MG tablet, TAKE 1 TABLET (4 MG TOTAL) BY MOUTH 3 (THREE) TIMES DAILY., Disp: 90 tablet, Rfl: 0 .  ezetimibe (ZETIA) 10 MG tablet, Take 1 tablet (10 mg total) by mouth daily., Disp: 90 tablet, Rfl: 3 .  meloxicam (MOBIC) 15 MG tablet, TAKE 1 TABLET (15 MG TOTAL) BY MOUTH DAILY., Disp: 30 tablet, Rfl: 0 .  mometasone (NASONEX) 50 MCG/ACT nasal spray, Place 2 sprays into the nose daily., Disp: 17 g, Rfl: 2  Allergies  Allergen Reactions  . Aspirin Rash  . Sulfa Antibiotics Rash  . Penicillins Nausea And Vomiting  . Statins     Muscle pain  . Latex  Rash    Other Reaction: WELTS    I personally reviewed active problem list, medication list, allergies, family history, social history with the patient/caregiver today.   ROS  Constitutional: Negative for fever or weight change.  Respiratory: Negative for cough and shortness of breath.   Cardiovascular: Negative for chest pain or palpitations.  Gastrointestinal: Negative for abdominal pain, no bowel changes.  Musculoskeletal: Negative for gait problem or joint swelling.  Skin: Negative for rash.  Neurological: Negative for dizziness or headache.  No other specific complaints in a complete review of systems (except as listed in HPI above).   Objective  Vitals:   04/05/20 0902  BP: 130/80  Pulse: 94  Resp: 16  Temp: 98.2 F (36.8 C)  TempSrc: Oral  SpO2: 99%  Weight: 134 lb 11.2 oz (61.1 kg)  Height: 4\' 11"  (1.499 m)    Body mass index is 27.21 kg/m.  Physical Exam  Constitutional: Patient appears well-developed and well-nourished. No distress.  HEENT: head atraumatic, normocephalic, pupils equal and reactive to light, neck supple Cardiovascular: Normal rate, regular rhythm and normal heart sounds.  No murmur heard. No BLE edema. Pulmonary/Chest: Effort normal and breath sounds normal. No respiratory distress. Abdominal: Soft.  There is no tenderness. Psychiatric: Patient has a normal mood and affect. behavior is normal. Judgment and thought content normal.  PHQ2/9: Depression screen [facility] 2/9 04/05/2020 04/05/2020 09/15/2019 08/24/2019 10/21/2018  Decreased Interest 0 0 0 0 0  Down, Depressed, Hopeless 0 0 0 0 0  PHQ - 2 Score 0 0 0 0 0  Altered sleeping - - 0 0 0  Tired, decreased energy - - 0 1 0  Change in appetite - - 0 0 0  Feeling bad or failure about yourself  - - 0 0 0  Trouble concentrating - - 0 0 0  Moving slowly or fidgety/restless - - 0 0 0  Suicidal thoughts - - 0 0 0  PHQ-9 Score - - 0 1 0  Difficult doing work/chores - - Not difficult at all Not  difficult at all -    phq 9 is negative   Fall Risk: Fall Risk  04/05/2020 04/05/2020 09/15/2019 08/24/2019 10/21/2018  Falls in the past year? 0 0 0 0 0  Comment - - - - -  Number falls in past yr: 0 0 - 0 0  Comment - - - - -  Injury with Fall? 0 0 - 0 0  Comment - - - - -  Risk for fall due to : - - - - -  Follow up - - - - -     Functional Status Survey: Is the patient deaf or have difficulty hearing?: Yes Does the patient have difficulty seeing, even when wearing glasses/contacts?: No Does the patient have difficulty concentrating, remembering, or making decisions?:  No Does the patient have difficulty walking or climbing stairs?: No Does the patient have difficulty dressing or bathing?: No Does the patient have difficulty doing errands alone such as visiting a doctor's office or shopping?: No   Assessment & Plan  1. Familial hyperlipidemia, high LDL  - ezetimibe (ZETIA) 10 MG tablet; Take 1 tablet (10 mg total) by mouth daily.  Dispense: 90 tablet; Refill: 3 - Lipid panel  2. Chronic bilateral low back pain without sciatica  - meloxicam (MOBIC) 15 MG tablet; TAKE 1 TABLET (15 MG TOTAL) BY MOUTH DAILY.  Dispense: 30 tablet; Refill: 0  3. Perennial allergic rhinitis  - mometasone (NASONEX) 50 MCG/ACT nasal spray; Place 2 sprays into the nose daily.  Dispense: 17 g; Refill: 2  4. GERD without esophagitis   5. History of Graves' disease  - TSH  6. Long-term use of high-risk medication  - COMPLETE METABOLIC PANEL WITH GFR - CBC with Differential/Platelet  7. Vitamin D deficiency  - VITAMIN D 25 Hydroxy (Vit-D Deficiency, Fractures)  8. Diabetes mellitus screening  - Hemoglobin A1c  9. Encounter for screening mammogram for malignant neoplasm of breast  - MM 3D SCREEN BREAST BILATERAL; Future

## 2020-04-05 ENCOUNTER — Ambulatory Visit: Payer: BC Managed Care – PPO | Admitting: Family Medicine

## 2020-04-05 ENCOUNTER — Encounter: Payer: Self-pay | Admitting: Family Medicine

## 2020-04-05 ENCOUNTER — Other Ambulatory Visit: Payer: Self-pay

## 2020-04-05 VITALS — BP 130/80 | HR 94 | Temp 98.2°F | Resp 16 | Ht 59.0 in | Wt 134.7 lb

## 2020-04-05 DIAGNOSIS — Z131 Encounter for screening for diabetes mellitus: Secondary | ICD-10-CM

## 2020-04-05 DIAGNOSIS — J3089 Other allergic rhinitis: Secondary | ICD-10-CM | POA: Diagnosis not present

## 2020-04-05 DIAGNOSIS — K219 Gastro-esophageal reflux disease without esophagitis: Secondary | ICD-10-CM | POA: Diagnosis not present

## 2020-04-05 DIAGNOSIS — M545 Low back pain, unspecified: Secondary | ICD-10-CM

## 2020-04-05 DIAGNOSIS — Z79899 Other long term (current) drug therapy: Secondary | ICD-10-CM

## 2020-04-05 DIAGNOSIS — E7849 Other hyperlipidemia: Secondary | ICD-10-CM

## 2020-04-05 DIAGNOSIS — Z23 Encounter for immunization: Secondary | ICD-10-CM

## 2020-04-05 DIAGNOSIS — Z8639 Personal history of other endocrine, nutritional and metabolic disease: Secondary | ICD-10-CM

## 2020-04-05 DIAGNOSIS — E559 Vitamin D deficiency, unspecified: Secondary | ICD-10-CM

## 2020-04-05 DIAGNOSIS — G8929 Other chronic pain: Secondary | ICD-10-CM

## 2020-04-05 DIAGNOSIS — Z1231 Encounter for screening mammogram for malignant neoplasm of breast: Secondary | ICD-10-CM

## 2020-04-05 MED ORDER — MELOXICAM 15 MG PO TABS: ORAL_TABLET | ORAL | 0 refills | Status: DC

## 2020-04-05 MED ORDER — MOMETASONE FUROATE 50 MCG/ACT NA SUSP: 2.0000 | Freq: Every day | NASAL | 2 refills | Status: DC

## 2020-04-05 MED ORDER — EZETIMIBE 10 MG PO TABS: 10.0000 mg | ORAL_TABLET | Freq: Every day | ORAL | 3 refills | Status: DC

## 2020-04-06 ENCOUNTER — Other Ambulatory Visit: Payer: Self-pay | Admitting: Family Medicine

## 2020-04-06 LAB — COMPLETE METABOLIC PANEL WITH GFR
AG Ratio: 1.8 (calc) (ref 1.0–2.5)
ALT: 20 U/L (ref 6–29)
AST: 23 U/L (ref 10–35)
Albumin: 4.5 g/dL (ref 3.6–5.1)
Alkaline phosphatase (APISO): 69 U/L (ref 37–153)
BUN: 10 mg/dL (ref 7–25)
CO2: 29 mmol/L (ref 20–32)
Calcium: 9.9 mg/dL (ref 8.6–10.4)
Chloride: 106 mmol/L (ref 98–110)
Creat: 0.72 mg/dL (ref 0.50–0.99)
GFR, Est African American: 103 mL/min/{1.73_m2} (ref >=60)
GFR, Est Non African American: 88 mL/min/{1.73_m2} (ref >=60)
Globulin: 2.5 g/dL (calc) (ref 1.9–3.7)
Glucose, Bld: 86 mg/dL (ref 65–99)
Potassium: 5 mmol/L (ref 3.5–5.3)
Sodium: 144 mmol/L (ref 135–146)
Total Bilirubin: 0.4 mg/dL (ref 0.2–1.2)
Total Protein: 7 g/dL (ref 6.1–8.1)

## 2020-04-06 LAB — LIPID PANEL
Cholesterol: 312 mg/dL — ABNORMAL HIGH (ref <200)
HDL: 67 mg/dL (ref >=50)
LDL Cholesterol (Calc): 215 mg/dL (calc) — ABNORMAL HIGH
Non-HDL Cholesterol (Calc): 245 mg/dL (calc) — ABNORMAL HIGH (ref <130)
Total CHOL/HDL Ratio: 4.7 (calc) (ref <5.0)
Triglycerides: 143 mg/dL (ref <150)

## 2020-04-06 LAB — CBC WITH DIFFERENTIAL/PLATELET
Absolute Monocytes: 603 cells/uL (ref 200–950)
Basophils Absolute: 17 cells/uL (ref 0–200)
Basophils Relative: 0.3 %
Eosinophils Absolute: 203 cells/uL (ref 15–500)
Eosinophils Relative: 3.5 %
HCT: 42.7 % (ref 35.0–45.0)
Hemoglobin: 14.3 g/dL (ref 11.7–15.5)
Lymphs Abs: 1531 cells/uL (ref 850–3900)
MCH: 30 pg (ref 27.0–33.0)
MCHC: 33.5 g/dL (ref 32.0–36.0)
MCV: 89.7 fL (ref 80.0–100.0)
MPV: 10.5 fL (ref 7.5–12.5)
Monocytes Relative: 10.4 %
Neutro Abs: 3445 cells/uL (ref 1500–7800)
Neutrophils Relative %: 59.4 %
Platelets: 292 10*3/uL (ref 140–400)
RBC: 4.76 10*6/uL (ref 3.80–5.10)
RDW: 12.7 % (ref 11.0–15.0)
Total Lymphocyte: 26.4 %
WBC: 5.8 10*3/uL (ref 3.8–10.8)

## 2020-04-06 LAB — HEMOGLOBIN A1C
Hgb A1c MFr Bld: 5.5 % of total Hgb (ref <5.7)
Mean Plasma Glucose: 111 mg/dL
eAG (mmol/L): 6.2 mmol/L

## 2020-04-06 LAB — TSH: TSH: 1.28 mIU/L (ref 0.40–4.50)

## 2020-04-06 LAB — VITAMIN D 25 HYDROXY (VIT D DEFICIENCY, FRACTURES): Vit D, 25-Hydroxy: 28 ng/mL — ABNORMAL LOW (ref 30–100)

## 2020-04-06 MED ORDER — REPATHA 140 MG/ML ~~LOC~~ SOSY: 140.0000 mg | PREFILLED_SYRINGE | SUBCUTANEOUS | 12 refills | Status: DC

## 2020-04-07 ENCOUNTER — Other Ambulatory Visit: Payer: Self-pay | Admitting: Family Medicine

## 2020-04-07 NOTE — Telephone Encounter (Signed)
   Notes to clinic:  Alternative Requested:THE PRESCRIBED MEDICATION IS NOT COVERED BY INSURANCE. PLEASE CONSIDER CHANGING TO ONE OF THE SUGGESTED COVERED ALTERNATIVES   Requested Prescriptions  Pending Prescriptions Disp Refills   PRALUENT 75 MG/ML SOAJ [Pharmacy Med Name: PRALUENT 75 MG/ML PEN]  0      Cardiovascular: PCSK9 Inhibitors Failed - 04/07/2020  8:09 AM      Failed - Total Cholesterol in normal range and within 360 days    Cholesterol, Total  Date Value Ref Range Status  04/23/2017 235 (H) 100 - 199 mg/dL Final   Cholesterol  Date Value Ref Range Status  04/05/2020 312 (H) <200 mg/dL Final          Failed - LDL in normal range and within 360 days    LDL Cholesterol (Calc)  Date Value Ref Range Status  04/05/2020 215 (H) mg/dL (calc) Final    Comment:    LDL-C levels > or = 190 mg/dL may indicate familial  hypercholesterolemia (FH). Clinical assessment and  measurement of blood lipid levels should be  considered for all first degree relatives of  patients with an FH diagnosis.  For questions about testing for familial hypercholesterolemia, please call Engineer, materials at [email or web address]. Wardell Honour, et al. J National Lipid Association  Recommendations for Patient-Centered Management of  Dyslipidemia: Part 1 Journal of Clinical Lipidology  2015;9(2), 129-169. Reference range: <100 . Desirable range <100 mg/dL for primary prevention;   <70 mg/dL for patients with CHD or diabetic patients  with > or = 2 CHD risk factors. Marland Kitchen LDL-C is now calculated using the Martin-Hopkins  calculation, which is a validated novel method providing  better accuracy than the Friedewald equation in the  estimation of LDL-C.  Horald Pollen et al. Lenox Ahr. 9381;829(93): 2061-2068  (http://education.[employer].com/faq/FAQ164)           Passed - HDL in normal range and within 360 days    HDL  Date Value Ref Range Status  04/05/2020 67 > OR = 50 mg/dL Final   71/69/6789 75 >38 mg/dL Final          Passed - Triglycerides in normal range and within 360 days    Triglycerides  Date Value Ref Range Status  04/05/2020 143 <150 mg/dL Final          Passed - Valid encounter within last 12 months    Recent Outpatient Visits           2 days ago Familial hyperlipidemia, high LDL   [facility] Alba Cory, MD   6 months ago Palpitations   [facility] Jamelle Haring, MD   7 months ago Palpitation   [facility] Alba Cory, MD   1 year ago Familial hyperlipidemia, high LDL   [facility] Alba Cory, MD   1 year ago Pleuritic chest pain   [facility] Alba Cory, MD       Future Appointments             In 5 months Carlynn Purl, Danna Hefty, MD [facility], [facility]

## 2020-04-26 MED ORDER — PRALUENT 75 MG/ML ~~LOC~~ SOAJ: 75.0000 mg | SUBCUTANEOUS | 5 refills | Status: DC

## 2020-04-26 NOTE — Addendum Note (Signed)
Addended by: Ruel Favors on: 04/26/2020 05:05 PM   Modules accepted: Orders

## 2020-04-28 ENCOUNTER — Encounter: Payer: Self-pay | Admitting: Family Medicine

## 2020-08-18 ENCOUNTER — Telehealth: Payer: Self-pay

## 2020-08-18 NOTE — Telephone Encounter (Signed)
Copied from CRM [id number]. Topic: General - Other >> Aug 18, 2020 11:21 AM Herby Abraham C wrote: Reason for CRM: pt called in stating that she received a letter from [employer] (her insurance) stating that some changes has been made with her provider credentials. Pt called to make provider aware and to make sure of any changes.   Please assist.

## 2020-10-03 ENCOUNTER — Ambulatory Visit: Payer: BC Managed Care – PPO | Admitting: Family Medicine

## 2020-10-26 NOTE — Progress Notes (Signed)
Name: Mia Camacho   MRN: [id number]    DOB: [birth year]   Date:10/27/2020       Progress Note  Subjective  Chief Complaint  Follow Up  HPI  History of Graves Disease: no recent visits to Endo, off methimazole , taking metoprolol only prn now, palpitation under control . No dysphagia   Familial hyperlipidemia: she was taking Zetia, however developed cramps again and stopped medication. Cannot tolerate statin therapy. She states father died from complications of cancer at age 45 ( bile duct ), mother had an MI in her 88's   Seasonal AR: not problems at this time, usually symptoms Spring and Fall. Uses Flonase prn   Vitamin D deficiency: she is not taking otc vitamin D 2000 units daily   Anxiety: she was having palpitations in 2021. Lost her mother April 2021 - she was her caregiver after she had an MI and multiple blocks - they cared for her for about 14 months. Daughter and son-in-law moved in with them in March 2021. She states her husband and herself retired at the same time September 2021. She was very stressed last year, lots of transitions, seen by Endo and told it was likely from anxiety. She states she is feeling better now   Chronic back pain : she has daily lower back pain.  She states had a flare recently but went to [employer] and is doing much better now. She states pain is described as a catch, no radiation to legs. Denies bowel or bladder incontinence   Patient Active Problem List   Diagnosis Date Noted   Anxiety 09/15/2019   Myalgia due to statin 05/01/2018   GERD without esophagitis 05/01/2018   Palpitations 12/07/2015   Perennial allergic rhinitis 09/13/2014   Back pain, chronic 09/13/2014   Diverticulosis 09/13/2014   Bursitis, trochanteric 09/13/2014   Herpes 09/13/2014   Hiatal hernia 09/13/2014   H/O: hysterectomy 09/13/2014   Personal history of urinary calculi 09/13/2014   Menopause 09/13/2014   Hernia, rectovaginal 09/13/2014   Primary  osteoarthritis of both knees 09/13/2014   Pelvic muscle wasting 09/13/2014   Cleft palate 09/13/2014   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/13/2014   Epicondylitis elbow, medial 03/18/2014   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   Gastroesophageal reflux disease with hiatal hernia 06/28/2009   Vitamin D deficiency 04/05/2009   Familial hyperlipidemia, high LDL 02/10/2009   History of Graves' disease 02/10/2009    Past Surgical History:  Procedure Laterality Date   ABDOMINAL HYSTERECTOMY     bladder tact     ROTATOR CUFF REPAIR      Family History  Problem Relation Age of Onset   Hyperlipidemia Mother    Hypertension Mother    Diabetes Mother    Heart attack Mother 84   Heart failure Mother    Cancer Father     Social History   Tobacco Use   Smoking status: Never   Smokeless tobacco: Never  Substance Use Topics   Alcohol use: No    Alcohol/week: 0.0 standard drinks     Current Outpatient Medications:    Alirocumab (PRALUENT) 75 MG/ML SOAJ, Inject 75 mg into the skin every 14 (fourteen) days., Disp: 2 mL, Rfl: 5   cholecalciferol (VITAMIN D) 1000 units tablet, Take 1,000 Units by mouth as needed., Disp: , Rfl:    ezetimibe (ZETIA) 10 MG tablet, Take 1 tablet (10 mg total) by mouth daily., Disp: 90 tablet, Rfl: 3   meloxicam (MOBIC) 15 MG tablet, TAKE  1 TABLET (15 MG TOTAL) BY MOUTH DAILY., Disp: 30 tablet, Rfl: 0   metoprolol tartrate (LOPRESSOR) 25 MG tablet, TAKE 1 TABLET BY MOUTH TWICE A DAY, Disp: 180 tablet, Rfl: 0   mometasone (NASONEX) 50 MCG/ACT nasal spray, Place 2 sprays into the nose daily., Disp: 17 g, Rfl: 2   Multiple Vitamin (MULTIVITAMIN) capsule, Take 1 capsule by mouth daily., Disp: , Rfl:    omeprazole (PRILOSEC) 40 MG capsule, TAKE 1 CAPSULE BY MOUTH EVERY DAY, Disp: 90 capsule, Rfl: 0   polyethylene glycol (MIRALAX / GLYCOLAX) packet, Take 1 packet by mouth daily., Disp: , Rfl:    tiZANidine (ZANAFLEX) 4 MG tablet, TAKE 1 TABLET (4 MG TOTAL) BY MOUTH 3 (THREE)  TIMES DAILY., Disp: 90 tablet, Rfl: 0  Allergies  Allergen Reactions   Aspirin Rash   Sulfa Antibiotics Rash   Penicillins Nausea And Vomiting   Statins     Muscle pain   Latex Rash    Other Reaction: WELTS    I personally reviewed active problem list, medication list, allergies, family history, social history, health maintenance with the patient/caregiver today.   ROS  Constitutional: Negative for fever or weight change.  Respiratory: Negative for cough and shortness of breath.   Cardiovascular: Negative for chest pain or palpitations.  Gastrointestinal: Negative for abdominal pain, no bowel changes.  Musculoskeletal: Negative for gait problem or joint swelling.  Skin: Negative for rash.  Neurological: Negative for dizziness or headache.  No other specific complaints in a complete review of systems (except as listed in HPI above).   Objective  Vitals:   10/27/20 1042  BP: 110/68  Pulse: 89  Resp: 16  Temp: 98 F (36.7 C)  SpO2: 99%  Weight: 132 lb (59.9 kg)  Height: 4\' 11"  (1.499 m)    Body mass index is 26.66 kg/m.  Physical Exam  Constitutional: Patient appears well-developed and well-nourished.  No distress.  HEENT: head atraumatic, normocephalic, pupils equal and reactive to light,neck supple Cardiovascular: Normal rate, regular rhythm and normal heart sounds.  No murmur heard. No BLE edema. Pulmonary/Chest: Effort normal and breath sounds normal. No respiratory distress. Abdominal: Soft.  There is no tenderness. Psychiatric: Patient has a normal mood and affect. behavior is normal. Judgment and thought content normal.   PHQ2/9: Depression screen [facility] 2/9 10/27/2020 04/05/2020 04/05/2020 09/15/2019 08/24/2019  Decreased Interest 0 0 0 0 0  Down, Depressed, Hopeless 0 0 0 0 0  PHQ - 2 Score 0 0 0 0 0  Altered sleeping - - - 0 0  Tired, decreased energy - - - 0 1  Change in appetite - - - 0 0  Feeling bad or failure about yourself  - - - 0 0  Trouble  concentrating - - - 0 0  Moving slowly or fidgety/restless - - - 0 0  Suicidal thoughts - - - 0 0  PHQ-9 Score - - - 0 1  Difficult doing work/chores - - - Not difficult at all Not difficult at all    phq 9 is negative   Fall Risk: Fall Risk  10/27/2020 04/05/2020 04/05/2020 09/15/2019 08/24/2019  Falls in the past year? 0 0 0 0 0  Comment - - - - -  Number falls in past yr: 0 0 0 - 0  Comment - - - - -  Injury with Fall? 0 0 0 - 0  Comment - - - - -  Risk for fall due to : No Fall Risks - - - -  Follow up Falls prevention discussed - - - -     Functional Status Survey: Is the patient deaf or have difficulty hearing?: No Does the patient have difficulty seeing, even when wearing glasses/contacts?: No Does the patient have difficulty concentrating, remembering, or making decisions?: No Does the patient have difficulty walking or climbing stairs?: No Does the patient have difficulty dressing or bathing?: No Does the patient have difficulty doing errands alone such as visiting a doctor's office or shopping?: No    Assessment & Plan  1. Familial hyperlipidemia, high LDL   2. Chronic bilateral low back pain without sciatica  Keep visits with chiropractor  3. GERD without esophagitis   4. History of Graves' disease   5. Vitamin D deficiency   6. Seasonal allergic rhinitis, unspecified trigger   7. Encounter for screening mammogram for malignant neoplasm of breast  Going to [facility]

## 2020-10-27 ENCOUNTER — Encounter: Payer: Self-pay | Admitting: Family Medicine

## 2020-10-27 ENCOUNTER — Ambulatory Visit: Payer: BC Managed Care – PPO | Admitting: Family Medicine

## 2020-10-27 ENCOUNTER — Other Ambulatory Visit: Payer: Self-pay

## 2020-10-27 VITALS — BP 110/68 | HR 89 | Temp 98.0°F | Resp 16 | Ht 59.0 in | Wt 132.0 lb

## 2020-10-27 DIAGNOSIS — K219 Gastro-esophageal reflux disease without esophagitis: Secondary | ICD-10-CM | POA: Diagnosis not present

## 2020-10-27 DIAGNOSIS — M545 Low back pain, unspecified: Secondary | ICD-10-CM

## 2020-10-27 DIAGNOSIS — Z8639 Personal history of other endocrine, nutritional and metabolic disease: Secondary | ICD-10-CM | POA: Diagnosis not present

## 2020-10-27 DIAGNOSIS — J302 Other seasonal allergic rhinitis: Secondary | ICD-10-CM

## 2020-10-27 DIAGNOSIS — E7849 Other hyperlipidemia: Secondary | ICD-10-CM | POA: Diagnosis not present

## 2020-10-27 DIAGNOSIS — G8929 Other chronic pain: Secondary | ICD-10-CM

## 2020-10-27 DIAGNOSIS — E559 Vitamin D deficiency, unspecified: Secondary | ICD-10-CM

## 2020-10-27 DIAGNOSIS — Z1231 Encounter for screening mammogram for malignant neoplasm of breast: Secondary | ICD-10-CM

## 2021-03-23 ENCOUNTER — Ambulatory Visit: Payer: Self-pay

## 2021-03-23 NOTE — Telephone Encounter (Signed)
°  Chief Complaint: abdominal pain Symptoms: LLQ abd pain, diarrhea x 2-3 days, now black solid stools Frequency: 4-5 days Pertinent Negatives: NA Disposition: [] ED /[] Urgent Care (no appt availability in office) / [x] Appointment(In office/virtual)/ []  [facility]/ [] Home Care/ [] Refused Recommended Disposition /[] [facility]/ []  Follow-up with PCP Additional Notes: Pt states she took Pepto for diarrhea 2-3 times, last dose was last night. Now pt has black stools that are more solid. Advised pt black stools can be result from Pepto and just to monitor symptoms. Can take Imodium in place of Pepto.    Reason for Disposition  Unusual stool color probably from food or medicine  Answer Assessment - Initial Assessment Questions 1. COLOR: "What color is it?" "Is that color in part or all of the stool?"     Black, solid 2. ONSET: "When was the unusual color first noted?"     yesterday 3. CAUSE: "Have you eaten any food or taken any medicine of this color?" (See listing in BACKGROUND)     Took pepto, 3 doses last night last dose 4. OTHER SYMPTOMS: "Do you have any other symptoms?" (e.g., diarrhea, jaundice, abdominal pain, fever).     LLQ abd pain, diarrhea x 2-3 days  Protocols used: Stools - Unusual Color-A-AH

## 2021-03-27 ENCOUNTER — Encounter: Payer: Self-pay | Admitting: Physician Assistant

## 2021-03-27 ENCOUNTER — Ambulatory Visit (INDEPENDENT_AMBULATORY_CARE_PROVIDER_SITE_OTHER): Payer: Medicare Other | Admitting: Physician Assistant

## 2021-03-27 ENCOUNTER — Other Ambulatory Visit: Payer: Self-pay

## 2021-03-27 VITALS — BP 106/62 | HR 79 | Temp 98.1°F | Resp 16 | Ht 59.0 in | Wt 136.4 lb

## 2021-03-27 DIAGNOSIS — K5792 Diverticulitis of intestine, part unspecified, without perforation or abscess without bleeding: Secondary | ICD-10-CM | POA: Insufficient documentation

## 2021-03-27 NOTE — Patient Instructions (Signed)
Based on your symptoms I think you may be experiencing an episode of uncomplicated diverticulitis   This can be managed with over the counter pain medications such as Tylenol and Ibuprofen as well as a soft diet for the next week As your symptoms improve you can start reintegrating more solid food to your meals I would also recommend increasing your daily fiber intake to 20-30 grams per day Do this slowly to prevent diarrhea   If you start having more severe symptoms please let us know I have added information on a soft diet and diverticulitis to your AVS for you to review  Please follow the instructions given regarding your stool cards and return them to [place] to rule out occult blood in your stool.   I recommend follow up in 1-2 weeks if your symptoms are not improving  Patient education: Diverticular disease (Beyond the Basics) - UpToDate

## 2021-03-27 NOTE — Assessment & Plan Note (Addendum)
Acute, recurrent problem  Patient has history of diverticulitis with diverticulosis dx in problem list Reports intermittent mild LLQ pain today - sometimes experiences relief with bowel movement Does not demonstrate signs of infection, sepsis, perforation, obstruction at this time Recommend soft diet and OTC pain relievers at this time - taken with food to assist with symptoms Discussed importance of fiber in diet and maintaining healthy, regular bowel movements Follow up in 1-2 weeks if symptoms are not improving Provided take home stool cards to determine presence of occult blood in stool- if positive may require CBC to test for anemia and GI consult for potential GI bleed. If symptoms are not improving with conservative measures- recommend CT abdomen and potential abx for resolution

## 2021-03-27 NOTE — Progress Notes (Signed)
Acute Office Visit  Subjective:    Patient ID: Mia Camacho, female    DOB: 07-18-1955, 66 y.o.   MRN: [id number]  Today's Provider: Jacquelin Hawking, MHS, PA-C  Introduced myself to the patient as a PA-C and provided education on APPs in clinical practice.    Chief Complaint  Patient presents with   Abdominal Pain    Lower left quadrant pain few months ago sometimes may spread over her to left back side as well.     Abdominal Pain Pertinent negatives include no constipation, diarrhea, fever, nausea or vomiting.   Patient is in today for LLQ abdominal pain (2/10 in severity) Pain is intermittent  States she has a history of diverticulitis and rectocele Pain seems to subside with bowel movements Denies painful bowel movements States she is eating a regular diet at this time   Reports that on Thurs and Fri she had dark stools- resolved with Pepto - states she thinks it was a stomach virus as she woke up with diarrhea on Tuesday      Past Medical History:  Diagnosis Date   Graves disease    Hyperlipidemia     Past Surgical History:  Procedure Laterality Date   ABDOMINAL HYSTERECTOMY     bladder tact     ROTATOR CUFF REPAIR      Family History  Problem Relation Age of Onset   Hyperlipidemia Mother    Hypertension Mother    Diabetes Mother    Heart attack Mother 51   Heart failure Mother    Cancer Father     Social History   Socioeconomic History   Marital status: Married    Spouse name: Darrell    Number of children: 3   Years of education: Not on file   Highest education level: Some college, no degree  Occupational History   Not on file  Tobacco Use   Smoking status: Never   Smokeless tobacco: Never  Vaping Use   Vaping Use: Never used  Substance and Sexual Activity   Alcohol use: No    Alcohol/week: 0.0 standard drinks   Drug use: No   Sexual activity: Yes    Partners: Male  Other Topics Concern   Not on file  Social History Narrative   Not  on file   Social Determinants of Health   Financial Resource Strain: Not on file  Food Insecurity: Not on file  Transportation Needs: Not on file  Physical Activity: Not on file  Stress: Not on file  Social Connections: Not on file  Intimate Partner Violence: Not on file    Outpatient Medications Prior to Visit  Medication Sig Dispense Refill   cholecalciferol (VITAMIN D) 1000 units tablet Take 1,000 Units by mouth as needed.     meloxicam (MOBIC) 15 MG tablet TAKE 1 TABLET (15 MG TOTAL) BY MOUTH DAILY. 30 tablet 0   metoprolol tartrate (LOPRESSOR) 25 MG tablet TAKE 1 TABLET BY MOUTH TWICE A DAY 180 tablet 0   mometasone (NASONEX) 50 MCG/ACT nasal spray Place 2 sprays into the nose daily. 17 g 2   Multiple Vitamin (MULTIVITAMIN) capsule Take 1 capsule by mouth daily.     omeprazole (PRILOSEC) 40 MG capsule TAKE 1 CAPSULE BY MOUTH EVERY DAY 90 capsule 0   polyethylene glycol (MIRALAX / GLYCOLAX) packet Take 1 packet by mouth daily.     tiZANidine (ZANAFLEX) 4 MG tablet TAKE 1 TABLET (4 MG TOTAL) BY MOUTH 3 (THREE) TIMES DAILY. 90 tablet  0   ezetimibe (ZETIA) 10 MG tablet Take 1 tablet (10 mg total) by mouth daily. (Patient not taking: Reported on 03/27/2021) 90 tablet 3   No facility-administered medications prior to visit.    Allergies  Allergen Reactions   Aspirin Rash   Sulfa Antibiotics Rash   Penicillins Nausea And Vomiting   Statins     Muscle pain   Latex Rash    Other Reaction: WELTS    Review of Systems  Constitutional:  Negative for appetite change, diaphoresis, fatigue and fever.  Gastrointestinal:  Positive for abdominal pain. Negative for constipation, diarrhea, nausea and vomiting.      Objective:    Physical Exam Vitals reviewed.  Constitutional:      Appearance: She is well-developed.  HENT:     Head: Normocephalic and atraumatic.  Cardiovascular:     Rate and Rhythm: Normal rate and regular rhythm.     Pulses: Normal pulses.     Heart sounds:  Normal heart sounds.  Pulmonary:     Effort: Pulmonary effort is normal.     Breath sounds: Normal breath sounds. No stridor. No wheezing or rales.  Abdominal:     General: Abdomen is flat. Bowel sounds are normal.     Palpations: Abdomen is soft. There is no shifting dullness, fluid wave or mass.     Tenderness: There is abdominal tenderness in the left lower quadrant. There is no right CVA tenderness, left CVA tenderness, guarding or rebound.     Hernia: No hernia is present.  Neurological:     Mental Status: She is alert.    BP 106/62    Pulse 79    Temp 98.1 F (36.7 C) (Oral)    Resp 16    Ht 4\' 11"  (1.499 m)    Wt 136 lb 6.4 oz (61.9 kg)    SpO2 99%    BMI 27.55 kg/m  Wt Readings from Last 3 Encounters:  03/27/21 136 lb 6.4 oz (61.9 kg)  10/27/20 132 lb (59.9 kg)  04/05/20 134 lb 11.2 oz (61.1 kg)    Health Maintenance Due  Topic Date Due   Pneumonia Vaccine 72+ Years old (1 - PCV) Never done   DEXA SCAN  Never done   MAMMOGRAM  12/23/2020    There are no preventive care reminders to display for this patient.   Lab Results  Component Value Date   TSH 1.28 04/05/2020   Lab Results  Component Value Date   WBC 5.8 04/05/2020   HGB 14.3 04/05/2020   HCT 42.7 04/05/2020   MCV 89.7 04/05/2020   PLT 292 04/05/2020   Lab Results  Component Value Date   NA 144 04/05/2020   K 5.0 04/05/2020   CO2 29 04/05/2020   GLUCOSE 86 04/05/2020   BUN 10 04/05/2020   CREATININE 0.72 04/05/2020   BILITOT 0.4 04/05/2020   ALKPHOS 70 04/23/2017   AST 23 04/05/2020   ALT 20 04/05/2020   PROT 7.0 04/05/2020   ALBUMIN 4.9 (H) 04/23/2017   CALCIUM 9.9 04/05/2020   ANIONGAP 7 08/21/2019   Lab Results  Component Value Date   CHOL 312 (H) 04/05/2020   Lab Results  Component Value Date   HDL 67 04/05/2020   Lab Results  Component Value Date   LDLCALC 215 (H) 04/05/2020   Lab Results  Component Value Date   TRIG 143 04/05/2020   Lab Results  Component Value Date    CHOLHDL 4.7 04/05/2020   Lab  Results  Component Value Date   HGBA1C 5.5 04/05/2020       Assessment & Plan:     Problem List Items Addressed This Visit       Other   Diverticulitis - Primary    Acute, recurrent problem  Patient has history of diverticulitis with diverticulosis dx in problem list Reports intermittent mild LLQ pain today - sometimes experiences relief with bowel movement Does not demonstrate signs of infection, sepsis, perforation, obstruction at this time Recommend soft diet and OTC pain relievers at this time - taken with food to assist with symptoms Discussed importance of fiber in diet and maintaining healthy, regular bowel movements Follow up in 1-2 weeks if symptoms are not improving Provided take home stool cards to determine presence of occult blood in stool- if positive may require CBC to test for anemia and GI consult for potential GI bleed. If symptoms are not improving with conservative measures- recommend CT abdomen and potential abx for resolution       Relevant Orders   POC Hemoccult Bld/Stl (3-Cd Home Screen)     No follow-ups on file.   I, Trevione Wert E Shakeila Pfarr, PA-C, have reviewed all documentation for this visit. The documentation on 03/27/21 for the exam, diagnosis, procedures, and orders are all accurate and complete.   Jonathan Kirkendoll, Mirian MoErin E, PA-C MPH [facility]    No orders of the defined types were placed in this encounter.    Elyn Krogh E Ebone Alcivar, PA-C

## 2021-03-29 ENCOUNTER — Telehealth: Payer: Self-pay

## 2021-03-29 ENCOUNTER — Ambulatory Visit: Payer: BC Managed Care – PPO

## 2021-03-29 DIAGNOSIS — K5792 Diverticulitis of intestine, part unspecified, without perforation or abscess without bleeding: Secondary | ICD-10-CM

## 2021-03-29 LAB — POC HEMOCCULT BLD/STL (HOME/3-CARD/SCREEN)
Card #1 Date: 1232023
Card #2 Date: 1242023
Card #2 Fecal Occult Blod, POC: NEGATIVE
Card #3 Date: 1252023
Card #3 Fecal Occult Blood, POC: NEGATIVE
Fecal Occult Blood, POC: NEGATIVE

## 2021-03-29 NOTE — Progress Notes (Signed)
Stool card resulted for 03/27/21 visit. All 3 negative

## 2021-03-29 NOTE — Telephone Encounter (Signed)
Stool card in lab

## 2021-05-02 ENCOUNTER — Ambulatory Visit: Payer: BC Managed Care – PPO | Admitting: Family Medicine

## 2021-05-10 NOTE — Progress Notes (Signed)
Name: Mia Camacho   MRN: [id number]    DOB: 09-29-1955   Date:05/11/2021       Progress Note  Subjective  Chief Complaint  Chief Complaint  Patient presents with   Follow-up    HPI  History of Graves Disease: no recent visits to Endo, off methimazole   Palpitation: she was  taking metoprolol only prn . She has seen cardiologist , Dr. Lady GaryFath in the past and was diagnosed with occasional PVC's and PAC's, she is concerned and would like to go back for re-evaluation since  palpitation has been happening more often She states occasionally palpitation causes her to feel dizzy intermittently, not associated with SOB or chest pain    Chronic back pain: she has a long history of back pain, she has been seeing chiropractor. Symptoms intermittent for about 10 years, diagnosed with DDD by chiropractor. She states pain is dull or sharp aggravated by activity better with rest.   LLQ pain: she has noticed an aching or sharp pain that goes from LLQ to to her back ( straight through) not associated with change in bowel movements or urination, no fever or chills. Pain can be aggravated by movement or when she lays down on right lateral decubitus. Going on for the past 6 months . She was seen in January by Denny PeonErin for follow up of black stools ( likely from peptobismol) after an episode of gastroenteritis. She was feeling better and did not take antibiotics, she stopped eating nuts and the pain has improved , however still concerned about it. She had a hysterectomy in her 30's and has one ovary left. We discussed referral to GI and if no imaging done we will check a pelvic US - she will contact me after GI visit as a reminder   Familial hyperlipidemia: she was taking Zetia, however developed cramps again and stopped medication. Cannot tolerate statin therapy. She states father died from complications of cancer at age 66 ( bile duct ), mother had an MI in her 10180's . We have discussed PCSK 9 and she is not interested     AR: not problems at this time, usually symptoms Spring and Fall. Uses Nasonex prn and doing well    Vitamin D deficiency: she is not taking otc vitamin D 2000 units daily     Patient Active Problem List   Diagnosis Date Noted   Diverticulitis 03/27/2021   Anxiety 09/15/2019   Myalgia due to statin 05/01/2018   GERD without esophagitis 05/01/2018   Palpitations 12/07/2015   Perennial allergic rhinitis 09/13/2014   Back pain, chronic 09/13/2014   Diverticulosis 09/13/2014   Bursitis, trochanteric 09/13/2014   Herpes 09/13/2014   Hiatal hernia 09/13/2014   H/O: hysterectomy 09/13/2014   Personal history of urinary calculi 09/13/2014   Menopause 09/13/2014   Hernia, rectovaginal 09/13/2014   Primary osteoarthritis of both knees 09/13/2014   Pelvic muscle wasting 09/13/2014   Cleft palate 09/13/2014   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/13/2014   Epicondylitis elbow, medial 03/18/2014   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   Gastroesophageal reflux disease with hiatal hernia 06/28/2009   Vitamin D deficiency 04/05/2009   Familial hyperlipidemia, high LDL 02/10/2009   History of Graves' disease 02/10/2009    Past Surgical History:  Procedure Laterality Date   ABDOMINAL HYSTERECTOMY     bladder tact     ROTATOR CUFF REPAIR      Family History  Problem Relation Age of Onset   Hyperlipidemia Mother    Hypertension  Mother    Diabetes Mother    Heart attack Mother 69   Heart failure Mother    Cancer Father     Social History   Tobacco Use   Smoking status: Never   Smokeless tobacco: Never  Substance Use Topics   Alcohol use: No    Alcohol/week: 0.0 standard drinks     Current Outpatient Medications:    cholecalciferol (VITAMIN D) 1000 units tablet, Take 1,000 Units by mouth as needed., Disp: , Rfl:    meloxicam (MOBIC) 15 MG tablet, TAKE 1 TABLET (15 MG TOTAL) BY MOUTH DAILY., Disp: 30 tablet, Rfl: 0   metoprolol tartrate (LOPRESSOR) 25 MG tablet, TAKE 1 TABLET BY  MOUTH TWICE A DAY, Disp: 180 tablet, Rfl: 0   mometasone (NASONEX) 50 MCG/ACT nasal spray, Place 2 sprays into the nose daily., Disp: 17 g, Rfl: 2   Multiple Vitamin (MULTIVITAMIN) capsule, Take 1 capsule by mouth daily., Disp: , Rfl:    omeprazole (PRILOSEC) 40 MG capsule, TAKE 1 CAPSULE BY MOUTH EVERY DAY, Disp: 90 capsule, Rfl: 0   polyethylene glycol (MIRALAX / GLYCOLAX) packet, Take 1 packet by mouth daily., Disp: , Rfl:    tiZANidine (ZANAFLEX) 4 MG tablet, TAKE 1 TABLET (4 MG TOTAL) BY MOUTH 3 (THREE) TIMES DAILY., Disp: 90 tablet, Rfl: 0  Allergies  Allergen Reactions   Aspirin Rash   Sulfa Antibiotics Rash   Penicillins Nausea And Vomiting   Statins     Muscle pain   Latex Rash    Other Reaction: WELTS    I personally reviewed active problem list, medication list, allergies, family history, social history, health maintenance with the patient/caregiver today.   ROS  Constitutional: Negative for fever or weight change.  Respiratory: Negative for cough and shortness of breath.   Cardiovascular: Negative for chest pain or palpitations.  Gastrointestinal: positive  for abdominal pain, but no  bowel changes.  Musculoskeletal: Negative for gait problem or joint swelling.  Skin: Negative for rash.  Neurological: Negative for dizziness or headache.  No other specific complaints in a complete review of systems (except as listed in HPI above).   Objective  Vitals:   05/11/21 1027  BP: 112/68  Pulse: 98  Resp: 16  Temp: 98 F (36.7 C)  TempSrc: Oral  SpO2: 98%  Weight: 134 lb 4.8 oz (60.9 kg)  Height: 4\' 11"  (1.499 m)    Body mass index is 27.13 kg/m.  Physical Exam  Constitutional: Patient appears well-developed and well-nourished. Overweight.  No distress.  HEENT: head atraumatic, normocephalic, pupils equal and reactive to light, neck supple Cardiovascular: Normal rate, regular rhythm and normal heart sounds.  No murmur heard. No BLE edema. Pulmonary/Chest:  Effort normal and breath sounds normal. No respiratory distress. Abdominal: Soft.  There is epigastric and LLQ tenderness. Normal bowel sounds  Psychiatric: Patient has a normal mood and affect. behavior is normal. Judgment and thought content normal.   Recent Results (from the past 2160 hour(s))  POC Hemoccult Bld/Stl (3-Cd Home Screen)     Status: None   Collection Time: 03/29/21 12:14 PM  Result Value Ref Range   Card #1 Date 03/31/21    Fecal Occult Blood, POC Negative Negative   Card #2 Date 1242023    Card #2 Fecal Occult Blod, POC Negative    Card #3 Date 1252023    Card #3 Fecal Occult Blood, POC Negative      PHQ2/9: Depression screen [facility] 2/9 05/11/2021 03/27/2021 10/27/2020 04/05/2020 04/05/2020  Decreased Interest 0 0 0 0 0  Down, Depressed, Hopeless 0 0 0 0 0  PHQ - 2 Score 0 0 0 0 0  Altered sleeping 0 0 - - -  Tired, decreased energy 0 0 - - -  Change in appetite 0 0 - - -  Feeling bad or failure about yourself  0 0 - - -  Trouble concentrating 0 0 - - -  Moving slowly or fidgety/restless 0 0 - - -  Suicidal thoughts 0 0 - - -  PHQ-9 Score 0 0 - - -  Difficult doing work/chores Not difficult at all Not difficult at all - - -  Some recent data might be hidden    phq 9 is negative   Fall Risk: Fall Risk  05/11/2021 03/27/2021 10/27/2020 04/05/2020 04/05/2020  Falls in the past year? 0 0 0 0 0  Comment - - - - -  Number falls in past yr: 0 0 0 0 0  Comment - - - - -  Injury with Fall? 0 0 0 0 0  Comment - - - - -  Risk for fall due to : No Fall Risks No Fall Risks No Fall Risks - -  Follow up Falls prevention discussed Falls prevention discussed Falls prevention discussed - -     Functional Status Survey: Is the patient deaf or have difficulty hearing?: No Does the patient have difficulty seeing, even when wearing glasses/contacts?: No Does the patient have difficulty concentrating, remembering, or making decisions?: No Does the patient have difficulty walking or  climbing stairs?: No Does the patient have difficulty dressing or bathing?: No Does the patient have difficulty doing errands alone such as visiting a doctor's office or shopping?: No    Assessment & Plan   1. History of Graves' disease  - TSH + free T4  2. Palpitation  - Ambulatory referral to Cardiology - TSH + free T4 - CBC with Differential/Platelet - COMPLETE METABOLIC PANEL WITH GFR  3. Perennial allergic rhinitis  - mometasone (NASONEX) 50 MCG/ACT nasal spray; Place 2 sprays into the nose daily.  Dispense: 31 g; Refill: 0  4. Breast cancer screening by mammogram  - MM 3D SCREEN BREAST BILATERAL; Future  5. Familial hyperlipidemia, high LDL  - Lipid panel  6. Perennial allergic rhinitis with seasonal variation   7. GERD without esophagitis   8. Chronic bilateral low back pain without sciatica   9. Osteoporosis screening  - DG Bone Density; Future  10. Need for pneumococcal vaccine   11. Needs flu shot

## 2021-05-11 ENCOUNTER — Encounter: Payer: Self-pay | Admitting: Family Medicine

## 2021-05-11 ENCOUNTER — Ambulatory Visit: Payer: Medicare Other | Admitting: Family Medicine

## 2021-05-11 ENCOUNTER — Other Ambulatory Visit: Payer: Self-pay

## 2021-05-11 VITALS — BP 112/68 | HR 98 | Temp 98.0°F | Resp 16 | Ht 59.0 in | Wt 134.3 lb

## 2021-05-11 DIAGNOSIS — Z8639 Personal history of other endocrine, nutritional and metabolic disease: Secondary | ICD-10-CM

## 2021-05-11 DIAGNOSIS — R002 Palpitations: Secondary | ICD-10-CM

## 2021-05-11 DIAGNOSIS — M545 Low back pain, unspecified: Secondary | ICD-10-CM

## 2021-05-11 DIAGNOSIS — Z1231 Encounter for screening mammogram for malignant neoplasm of breast: Secondary | ICD-10-CM | POA: Diagnosis not present

## 2021-05-11 DIAGNOSIS — J3089 Other allergic rhinitis: Secondary | ICD-10-CM

## 2021-05-11 DIAGNOSIS — K219 Gastro-esophageal reflux disease without esophagitis: Secondary | ICD-10-CM

## 2021-05-11 DIAGNOSIS — R1032 Left lower quadrant pain: Secondary | ICD-10-CM

## 2021-05-11 DIAGNOSIS — E7849 Other hyperlipidemia: Secondary | ICD-10-CM | POA: Diagnosis not present

## 2021-05-11 DIAGNOSIS — Z13820 Encounter for screening for osteoporosis: Secondary | ICD-10-CM

## 2021-05-11 DIAGNOSIS — Z23 Encounter for immunization: Secondary | ICD-10-CM

## 2021-05-11 DIAGNOSIS — G8929 Other chronic pain: Secondary | ICD-10-CM

## 2021-05-11 DIAGNOSIS — J302 Other seasonal allergic rhinitis: Secondary | ICD-10-CM

## 2021-05-11 MED ORDER — MOMETASONE FUROATE 50 MCG/ACT NA SUSP: 2.0000 | Freq: Every day | NASAL | 0 refills | Status: DC

## 2021-05-11 NOTE — Patient Instructions (Signed)
Preventive Care 34 Years and Older, Female ?Preventive care refers to lifestyle choices and visits with your health care provider that can promote health and wellness. Preventive care visits are also called wellness exams. ?What can I expect for my preventive care visit? ?Counseling ?Your health care provider may ask you questions about your: ?Medical history, including: ?Past medical problems. ?Family medical history. ?Pregnancy and menstrual history. ?History of falls. ?Current health, including: ?Memory and ability to understand (cognition). ?Emotional well-being. ?Home life and relationship well-being. ?Sexual activity and sexual health. ?Lifestyle, including: ?Alcohol, nicotine or tobacco, and drug use. ?Access to firearms. ?Diet, exercise, and sleep habits. ?Work and work Statistician. ?Sunscreen use. ?Safety issues such as seatbelt and bike helmet use. ?Physical exam ?Your health care provider will check your: ?Height and weight. These may be used to calculate your BMI (body mass index). BMI is a measurement that tells if you are at a healthy weight. ?Waist circumference. This measures the distance around your waistline. This measurement also tells if you are at a healthy weight and may help predict your risk of certain diseases, such as type 2 diabetes and high blood pressure. ?Heart rate and blood pressure. ?Body temperature. ?Skin for abnormal spots. ?What immunizations do I need? ?Vaccines are usually given at various ages, according to a schedule. Your health care provider will recommend vaccines for you based on your age, medical history, and lifestyle or other factors, such as travel or where you work. ?What tests do I need? ?Screening ?Your health care provider may recommend screening tests for certain conditions. This may include: ?Lipid and cholesterol levels. ?Hepatitis C test. ?Hepatitis B test. ?HIV (human immunodeficiency virus) test. ?STI (sexually transmitted infection) testing, if you are at  risk. ?Lung cancer screening. ?Colorectal cancer screening. ?Diabetes screening. This is done by checking your blood sugar (glucose) after you have not eaten for a while (fasting). ?Mammogram. Talk with your health care provider about how often you should have regular mammograms. ?BRCA-related cancer screening. This may be done if you have a family history of breast, ovarian, tubal, or peritoneal cancers. ?Bone density scan. This is done to screen for osteoporosis. ?Talk with your health care provider about your test results, treatment options, and if necessary, the need for more tests. ?Follow these instructions at home: ?Eating and drinking ? ?Eat a diet that includes fresh fruits and vegetables, whole grains, lean protein, and low-fat dairy products. Limit your intake of foods with high amounts of sugar, saturated fats, and salt. ?Take vitamin and mineral supplements as recommended by your health care provider. ?Do not drink alcohol if your health care provider tells you not to drink. ?If you drink alcohol: ?Limit how much you have to 0-1 drink a day. ?Know how much alcohol is in your drink. In the [place] one drink equals one 12 oz bottle of beer (355 mL), one 5 oz glass of wine (148 mL), or one 1? oz glass of hard liquor (44 mL). ?Lifestyle ?Brush your teeth every morning and night with fluoride toothpaste. Floss one time each day. ?Exercise for at least 30 minutes 5 or more days each week. ?Do not use any products that contain nicotine or tobacco. These products include cigarettes, chewing tobacco, and vaping devices, such as e-cigarettes. If you need help quitting, ask your health care provider. ?Do not use drugs. ?If you are sexually active, practice safe sex. Use a condom or other form of protection in order to prevent STIs. ?Take aspirin only as told by your  health care provider. Make sure that you understand how much to take and what form to take. Work with your health care provider to find out whether it  is safe and beneficial for you to take aspirin daily. ?Ask your health care provider if you need to take a cholesterol-lowering medicine (statin). ?Find healthy ways to manage stress, such as: ?Meditation, yoga, or listening to music. ?Journaling. ?Talking to a trusted person. ?Spending time with friends and family. ?Minimize exposure to UV radiation to reduce your risk of skin cancer. ?Safety ?Always wear your seat belt while driving or riding in a vehicle. ?Do not drive: ?If you have been drinking alcohol. Do not ride with someone who has been drinking. ?When you are tired or distracted. ?While texting. ?If you have been using any mind-altering substances or drugs. ?Wear a helmet and other protective equipment during sports activities. ?If you have firearms in your house, make sure you follow all gun safety procedures. ?What's next? ?Visit your health care provider once a year for an annual wellness visit. ?Ask your health care provider how often you should have your eyes and teeth checked. ?Stay up to date on all vaccines. ?This information is not intended to replace advice given to you by your health care provider. Make sure you discuss any questions you have with your health care provider. ?Document Revised: 08/17/2020 Document Reviewed: 08/17/2020 ?Elsevier Patient Education ? [place]. ? ?

## 2021-05-11 NOTE — Addendum Note (Signed)
Addended by: Alba Cory F on: 05/11/2021 11:22 AM ? ? Modules accepted: Orders ? ?

## 2021-05-12 LAB — LIPID PANEL
Cholesterol: 386 mg/dL — ABNORMAL HIGH (ref <200)
HDL: 74 mg/dL (ref >=50)
LDL Cholesterol (Calc): 287 mg/dL (calc) — ABNORMAL HIGH
Non-HDL Cholesterol (Calc): 312 mg/dL (calc) — ABNORMAL HIGH (ref <130)
Total CHOL/HDL Ratio: 5.2 (calc) — ABNORMAL HIGH (ref <5.0)
Triglycerides: 114 mg/dL (ref <150)

## 2021-05-12 LAB — COMPLETE METABOLIC PANEL WITH GFR
AG Ratio: 1.7 (calc) (ref 1.0–2.5)
ALT: 21 U/L (ref 6–29)
AST: 25 U/L (ref 10–35)
Albumin: 4.7 g/dL (ref 3.6–5.1)
Alkaline phosphatase (APISO): 72 U/L (ref 37–153)
BUN: 15 mg/dL (ref 7–25)
CO2: 27 mmol/L (ref 20–32)
Calcium: 10.3 mg/dL (ref 8.6–10.4)
Chloride: 101 mmol/L (ref 98–110)
Creat: 0.83 mg/dL (ref 0.50–1.05)
Globulin: 2.8 g/dL (calc) (ref 1.9–3.7)
Glucose, Bld: 84 mg/dL (ref 65–99)
Potassium: 4.8 mmol/L (ref 3.5–5.3)
Sodium: 138 mmol/L (ref 135–146)
Total Bilirubin: 0.5 mg/dL (ref 0.2–1.2)
Total Protein: 7.5 g/dL (ref 6.1–8.1)
eGFR: 78 mL/min/{1.73_m2} (ref >=60)

## 2021-05-12 LAB — CBC WITH DIFFERENTIAL/PLATELET
Absolute Monocytes: 542 cells/uL (ref 200–950)
Basophils Absolute: 23 cells/uL (ref 0–200)
Basophils Relative: 0.4 %
Eosinophils Absolute: 143 cells/uL (ref 15–500)
Eosinophils Relative: 2.5 %
HCT: 45.2 % — ABNORMAL HIGH (ref 35.0–45.0)
Hemoglobin: 15 g/dL (ref 11.7–15.5)
Lymphs Abs: 1716 cells/uL (ref 850–3900)
MCH: 29.6 pg (ref 27.0–33.0)
MCHC: 33.2 g/dL (ref 32.0–36.0)
MCV: 89.3 fL (ref 80.0–100.0)
MPV: 10.3 fL (ref 7.5–12.5)
Monocytes Relative: 9.5 %
Neutro Abs: 3278 cells/uL (ref 1500–7800)
Neutrophils Relative %: 57.5 %
Platelets: 317 10*3/uL (ref 140–400)
RBC: 5.06 10*6/uL (ref 3.80–5.10)
RDW: 13.2 % (ref 11.0–15.0)
Total Lymphocyte: 30.1 %
WBC: 5.7 10*3/uL (ref 3.8–10.8)

## 2021-05-12 LAB — TSH+FREE T4: TSH W/REFLEX TO FT4: 1.42 mIU/L (ref 0.40–4.50)

## 2021-05-16 ENCOUNTER — Telehealth: Payer: Self-pay

## 2021-05-16 ENCOUNTER — Other Ambulatory Visit: Payer: Self-pay

## 2021-05-16 DIAGNOSIS — E7849 Other hyperlipidemia: Secondary | ICD-10-CM

## 2021-05-16 DIAGNOSIS — R002 Palpitations: Secondary | ICD-10-CM

## 2021-05-16 NOTE — Telephone Encounter (Signed)
Copied from CRM [id number]. Topic: General - Other ?>> May 16, 2021 11:30 AM Aretta Nip wrote: ?Pt was just 3/9 and got a referral to Dr Lady Gary Cardio and he is not accepting new pt and it has been long enough pt is considered new. Pt would like a referral [facility]. Dr is Warnell Forester, [place] at [place],  Phone [phone] Fax is [phone]. Jerald at office stated if could be sent over quickly may could be seen within a few days. Pls FU asap ?

## 2021-05-16 NOTE — Telephone Encounter (Signed)
New referral placed/faxed.  ?

## 2021-06-06 NOTE — Progress Notes (Deleted)
Patient: Mia Camacho, Female    DOB: 07-03-1955, 66 y.o.   MRN: [id number] ? ?Visit Date: 06/06/2021 ? ?Today's Provider: Ruel Favors, MD  ? ?Welcome to Medicare ? ?Subjective:  ? ? ?HPI ?Mia Camacho is a 66 y.o. female who presents today for her Subsequent Annual Wellness Visit. ? ?Patient/Caregiver input:   ? ?Review of Systems ?*** ?Constitutional: Negative for fever or weight change.  ?Respiratory: Negative for cough and shortness of breath.   ?Cardiovascular: Negative for chest pain or palpitations.  ?Gastrointestinal: Negative for abdominal pain, no bowel changes.  ?Musculoskeletal: Negative for gait problem or joint swelling.  ?Skin: Negative for rash.  ?Neurological: Negative for dizziness or headache.  ?No other specific complaints in a complete review of systems (except as listed in HPI above). ? ?Past Medical History:  ?Diagnosis Date  ? Graves disease   ? Hyperlipidemia   ? ? ?Past Surgical History:  ?Procedure Laterality Date  ? ABDOMINAL HYSTERECTOMY    ? bladder tact    ? ROTATOR CUFF REPAIR    ? ? ?Family History  ?Problem Relation Age of Onset  ? Hyperlipidemia Mother   ? Hypertension Mother   ? Diabetes Mother   ? Heart attack Mother 28  ? Heart failure Mother   ? Cancer Father   ? ? ?Social History  ? ?Socioeconomic History  ? Marital status: Married  ?  Spouse name: Laban Emperor   ? Number of children: 3  ? Years of education: Not on file  ? Highest education level: Some college, no degree  ?Occupational History  ? Not on file  ?Tobacco Use  ? Smoking status: Never  ? Smokeless tobacco: Never  ?Vaping Use  ? Vaping Use: Never used  ?Substance and Sexual Activity  ? Alcohol use: No  ?  Alcohol/week: 0.0 standard drinks  ? Drug use: No  ? Sexual activity: Yes  ?  Partners: Male  ?Other Topics Concern  ? Not on file  ?Social History Narrative  ? Not on file  ? ?Social Determinants of Health  ? ?Financial Resource Strain: Not on file  ?Food Insecurity: Not on file  ?Transportation Needs: Not on file   ?Physical Activity: Not on file  ?Stress: Not on file  ?Social Connections: Not on file  ?Intimate Partner Violence: Not on file  ? ? ?Outpatient Encounter Medications as of 06/07/2021  ?Medication Sig  ? cholecalciferol (VITAMIN D) 1000 units tablet Take 1,000 Units by mouth as needed.  ? meloxicam (MOBIC) 15 MG tablet TAKE 1 TABLET (15 MG TOTAL) BY MOUTH DAILY.  ? metoprolol tartrate (LOPRESSOR) 25 MG tablet TAKE 1 TABLET BY MOUTH TWICE A DAY  ? mometasone (NASONEX) 50 MCG/ACT nasal spray Place 2 sprays into the nose daily.  ? Multiple Vitamin (MULTIVITAMIN) capsule Take 1 capsule by mouth daily.  ? omeprazole (PRILOSEC) 40 MG capsule TAKE 1 CAPSULE BY MOUTH EVERY DAY  ? polyethylene glycol (MIRALAX / GLYCOLAX) packet Take 1 packet by mouth daily.  ? tiZANidine (ZANAFLEX) 4 MG tablet TAKE 1 TABLET (4 MG TOTAL) BY MOUTH 3 (THREE) TIMES DAILY.  ? ?No facility-administered encounter medications on file as of 06/07/2021.  ? ? ?Allergies  ?Allergen Reactions  ? Aspirin Rash  ? Sulfa Antibiotics Rash  ? Penicillins Nausea And Vomiting  ? Statins   ?  Muscle pain  ? Latex Rash  ?  Other Reaction: WELTS  ? ? ?Care Team Updated in EHR: Yes ? ?Last Vision Exam: *** Wears  corrective lenses: {Yes/No:30480221} ?Last Dental Exam: *** ?Last Hearing Exam: *** Wears Hearing Aids: {Yes/No:30480221} ? ?Functional Ability / Safety Screening ?1.  Was the timed Get Up and Go test shorter than 30 seconds?  {Blank single:19197::"yes","no"} ?2.  Does the patient need help with the phone, transportation, shopping, ?     preparing meals, housework, laundry, medications, or managing money?  {Blank single:19197::"yes","no"} ?3.  Is the patient's home free of loose throw rugs in walkways, pet beds, electrical cords, etc?   {Blank single:19197::"yes","no"} ?     Grab bars in the bathroom? {Blank single:19197::"yes","no"} ?     Handrails on the stairs?   {Blank single:19197::"yes","no"} ?     Adequate lighting?   {Blank  single:19197::"yes","no"} ?4.  Has the patient noticed any hearing difficulties?   {Blank single:19197::"yes","no"} ? ?Diet Recall and Exercise Regimen: *** ? ?Advanced Care Planning: A voluntary discussion about advance care planning including the explanation and discussion of advance directives.  Discussed health care proxy and Living will, and the patient was able to identify a health care proxy as ***.  Patient {DOES_DOES ZOX:09604}OT:18564} have a living will at present time. If patient does have living will, I have requested they bring this to the clinic to be scanned in to their chart. ?Does patient have a HCPOA?    {Blank single:19197::"yes","no"} ?If yes, name and contact information:  ?Does patient have a living will or MOST form?  {Blank single:19197::"yes","no"} ? ?Cancer Screenings: ?Skin: *** ?Lung: *** Low Dose CT Chest recommended if Age 71-80 years, 30 pack-year currently smoking OR have quit w/in 15years. Patient {DOES NOT does:27190::"does not"} qualify. ?Breast: Ordered 05/11/21 Up to date on Mammogram? Yes  Up to date of Bone Density/Dexa? Ordered 05/11/21 ?Colon: *** ? ?Additional Screenings: *** ?Hepatitis B/HIV/Syphillis: N/A ?Hepatitis C Screening: 12/14/11 ?Intimate Partner Violence: Negative screen ? ?Objective:  ? ?Vitals: There were no vitals taken for this visit. ?There is no height or weight on file to calculate BMI. ? ?No results found. ? ?Physical Exam ?Constitutional: Patient appears well-developed and well-nourished. Obese *** No distress.  ?HEENT: head atraumatic, normocephalic, pupils equal and reactive to light, ears ***, neck supple, throat within normal limits ?Cardiovascular: Normal rate, regular rhythm and normal heart sounds.  No murmur heard. No BLE edema. ?Pulmonary/Chest: Effort normal and breath sounds normal. No respiratory distress. ?Abdominal: Soft.  There is no tenderness. ?Psychiatric: Patient has a normal mood and affect. behavior is normal. Judgment and thought content  normal. ? ?Cognitive Testing - 6-CIT ? Correct? Score   ?What year is it? {YES NO:22349} {Numbers; 0-4:31231} Yes = 0    No = 4  ?What month is it? {YES NO:22349} {Numbers; 0-4:31231} Yes = 0    No = 3  ?Remember:     ?Floyde ParkinsJohn Smith, [address] [place] [place], [place]     ?What time is it? {YES NO:22349} {Numbers; 0-4:31231} Yes = 0    No = 3  ?Count backwards from 20 to 1 {YES NO:22349} {Numbers; 0-4:31231} Correct = 0    1 error = 2   More than 1 error = 4  ?Say the months of the year in reverse. {YES NO:22349} {Numbers; 0-4:31231} Correct = 0    1 error = 2   More than 1 error = 4  ?What address did I ask you to remember? {YES NO:22349} {NUMBERS; 0-10:5044} Correct = 0  1 error = 2    ?2 error = 4    3 error = 6    ?  4 error = 8    All wrong = 10  ?     ?TOTAL SCORE  {Numbers; 4-27:06237}/62   ?Interpretation:  {Desc; normal/abnormal:11317::"Normal"}  Normal (0-7) Abnormal (8-28)  ? ?Fall Risk: ? ?  05/11/2021  ? 10:22 AM 03/27/2021  ?  8:05 AM 10/27/2020  ? 10:40 AM 04/05/2020  ?  9:03 AM 04/05/2020  ?  8:55 AM  ?Fall Risk   ?Falls in the past year? 0 0 0 0 0  ?Number falls in past yr: 0 0 0 0 0  ?Injury with Fall? 0 0 0 0 0  ?Risk for fall due to : No Fall Risks No Fall Risks No Fall Risks    ?Follow up Falls prevention discussed Falls prevention discussed Falls prevention discussed    ? ? ?Depression Screen ? ?  05/11/2021  ? 10:23 AM 03/27/2021  ?  8:05 AM 10/27/2020  ? 10:41 AM 04/05/2020  ?  9:03 AM 04/05/2020  ?  8:55 AM  ?Depression screen PHQ 2/9  ?Decreased Interest 0 0 0 0 0  ?Down, Depressed, Hopeless 0 0 0 0 0  ?PHQ - 2 Score 0 0 0 0 0  ?Altered sleeping 0 0     ?Tired, decreased energy 0 0     ?Change in appetite 0 0     ?Feeling bad or failure about yourself  0 0     ?Trouble concentrating 0 0     ?Moving slowly or fidgety/restless 0 0     ?Suicidal thoughts 0 0     ?PHQ-9 Score 0 0     ?Difficult doing work/chores Not difficult at all Not difficult at all     ? ? ?Recent Results (from the past 2160 hour(s))  ?POC Hemoccult  Bld/Stl (3-Cd Home Screen)     Status: None  ? Collection Time: 03/29/21 12:14 PM  ?Result Value Ref Range  ? Card #1 Date [id number]   ? Fecal Occult Blood, POC Negative Negative  ? Card #2 Date [id number]   ? Card #2 Fecal Occult Blod, POC Neg

## 2021-06-07 ENCOUNTER — Ambulatory Visit: Payer: BC Managed Care – PPO | Admitting: Family Medicine

## 2021-06-14 ENCOUNTER — Other Ambulatory Visit: Payer: Self-pay | Admitting: Family Medicine

## 2021-06-14 DIAGNOSIS — J3089 Other allergic rhinitis: Secondary | ICD-10-CM

## 2021-06-20 ENCOUNTER — Ambulatory Visit: Admit: 2021-06-20 | Discharge: 2021-06-21 | Payer: MEDICARE

## 2021-06-20 DIAGNOSIS — E785 Hyperlipidemia, unspecified: Secondary | ICD-10-CM | POA: Diagnosis not present

## 2021-06-20 DIAGNOSIS — E7849 Other hyperlipidemia: Secondary | ICD-10-CM | POA: Diagnosis not present

## 2021-06-20 DIAGNOSIS — R9431 Abnormal electrocardiogram [ECG] [EKG]: Secondary | ICD-10-CM | POA: Diagnosis not present

## 2021-06-20 DIAGNOSIS — R002 Palpitations: Secondary | ICD-10-CM | POA: Diagnosis not present

## 2021-06-20 DIAGNOSIS — G729 Myopathy, unspecified: Secondary | ICD-10-CM | POA: Diagnosis not present

## 2021-06-20 MED ORDER — PRALUENT PEN 150 MG/ML SUBCUTANEOUS PEN INJECTOR
SUBCUTANEOUS | 3 refills | 84 days | Status: CP
Start: 2021-06-20 — End: ?
  Filled 2021-06-29: qty 6, 84d supply, fill #0

## 2021-06-28 MED ORDER — EMPTY CONTAINER
2 refills | 0 days
Start: 2021-06-28 — End: ?

## 2021-06-28 NOTE — Unmapped (Signed)
[facility]   Patient Onboarding/Medication Counseling    Adrienne Roberson is a 66 y.o. female with familial hyperlipidemia who I am counseling today on initiation of therapy.  I am speaking to the patient.    Was a Nurse, learning disability used for this call? No    Verified patient's date of birth / HIPAA.    Specialty medication(s) to be sent: General Specialty: Praluent      Non-specialty medications/supplies to be sent: Higher education careers adviser      Medications not needed at this time: n/a         Praluent (alirocumab)    Medication & Administration     Dosage: Inject the contents of 1 pen (150mg ) under the skin every 2 weeks.    Administration: Inject under the skin of the thigh, abdomen or upper arm. Rotate sites with each injection.  ??? Injection instructions - Pen:  o Take 1 (or 2) pens out of the refrigerator and allow to stand at room temperature for 30 to 40 minutes  o Check the pen(s) for the following:  - Expiration date  - Absence of damage or cracks  - Medication is clear, colorless or pale yellow and free from particles  o Choose your injection site and clean with alcohol wipe. Allow to air dry completely.  o Pull the blue needle cap straight off and discard  o Hold the pen in your palm with your thumb over the green button, but do not touch the green button yet.  o Press the pen unto your skin at a 90 degree angle until the yellow safety cover disappears  o Push the green button and immediately release. You will hear a ???click.??? This signifies that the injection has started.  o Continue to hold the pen in place, maintaining pressure, until the entire window has turned yellow.  - This may take up to 20 seconds.  - You may hear a second ???click??? when the injection is complete.  o Lift the pen straight off your skin and dispose of it in a sharps container  o If there is blood at the injection site gently press a cotton ball or guaze to the site. Do not rub the injection site.    Adherence/Missed dose instructions: Administer a missed dose within 7 days and resume your normal schedule. If it has been more than 7 days and you inject every 2 weeks, skip the missed dose and resume your normal schedule..     Goals of Therapy     Lower cholesterol  Lower the risk of heart attack, stroke and unstable angina in people with heart disease    Side Effects & Monitoring Parameters   ??? Injection site irritation  ??? Flu-like symptoms  ??? Nose or throat irritation    The following side effects should be reported to the provider:  ??? Signs of an allergic reaction    Contraindications, Warnings, & Precautions     ??? Hypersensitivity    Drug/Food Interactions     ??? Medication list reviewed in Epic. The patient was instructed to inform the care team before taking any new medications or supplements. No drug interactions identified.     Storage, Handling Precautions, & Disposal     ??? Praluent should be stored in the refrigerator.   o If necessary Praluent may be stored at room temperature, in the original carton, for no more than 30 days.  ??? Place used devices into a sharps container for disposal  Current Medications (including OTC/herbals), Comorbidities and Allergies     Current Outpatient Medications   Medication Sig Dispense Refill   ??? alirocumab (PRALUENT PEN) 150 mg/mL subcutaneous injection Inject the contents of one pen (150 mg total) under the skin every fourteen (14) days. 6 mL 3   ??? azelastine (ASTELIN) 137 mcg (0.1 %) nasal spray 1 spray into each nostril.     ??? metoprolol tartrate (LOPRESSOR) 25 MG tablet Take 1 tablet (25 mg total) by mouth Two (2) times a day.       No current facility-administered medications for this visit.       Allergies   Allergen Reactions   ??? Aspirin Hives and Rash   ??? Sulfa (Sulfonamide Antibiotics) Rash   ??? Latex, Natural Rubber    ??? Statins-Hmg-Coa Reductase Inhibitors      Muscle pain       There is no problem list on file for this patient.      Reviewed and up to date in Epic.    Appropriateness of Therapy     Acute infections noted within Epic:  No active infections  Patient reported infection: None    Is medication and dose appropriate based on diagnosis and infection status? Yes    Prescription has been clinically reviewed: Yes      Baseline Quality of Life Assessment      How many days over the past month did your familial hyperlipidemia  keep you from your normal activities? For example, brushing your teeth or getting up in the morning. Patient declined to answer    Financial Information     Medication Assistance provided: Prior Authorization and Copay Assistance    Anticipated copay of $35 (84 days) reviewed with patient. Verified delivery address.    Delivery Information     Scheduled delivery date: 06/29/21    Expected start date: 06/29/21    Medication will be delivered via Same Day Courier to the prescription address in [facility].  This shipment will not require a signature.      Explained the services we provide at [facility] and that each month we would call to set up refills.  Stressed importance of returning phone calls so that we could ensure they receive their medications in time each month.  Informed patient that we should be setting up refills 7-10 days prior to when they will run out of medication.  A pharmacist will reach out to perform a clinical assessment periodically.  Informed patient that a welcome packet, containing information about our pharmacy and other support services, a Notice of Privacy Practices, and a drug information handout will be sent.      The patient or caregiver noted above participated in the development of this care plan and knows that they can request review of or adjustments to the care plan at any time.      Patient or caregiver verbalized understanding of the above information as well as how to contact the pharmacy at [phone] option 4 with any questions/concerns.  The pharmacy is open Monday through Friday 8:30am-4:30pm.  A pharmacist is available 24/7 via pager to answer any clinical questions they may have.    Patient Specific Needs     - Does the patient have any physical, cognitive, or cultural barriers? No    - Does the patient have adequate living arrangements? (i.e. the ability to store and take their medication appropriately) Yes    - Did you identify any  home environmental safety or security hazards? No    - Patient prefers to have medications discussed with  Patient     - Is the patient or caregiver able to read and understand education materials at a high school level or above? Yes    - Patient's primary language is  English     - Is the patient high risk? No    SOCIAL DETERMINANTS OF HEALTH     At the [facility], we have learned that life circumstances - like trouble affording food, housing, utilities, or transportation can affect the health of many of our patients.   That is why we wanted to ask: are you currently experiencing any life circumstances that are negatively impacting your health and/or quality of life? Patient declined to answer    Social Determinants of Health     Financial Resource Strain: Not on file   Internet Connectivity: Not on file   Food Insecurity: Not on file   Tobacco Use: Not on file   Housing/Utilities: Not on file   Alcohol Use: Not on file   Transportation Needs: Not on file   Substance Use: Not on file   Health Literacy: Not on file   Physical Activity: Not on file   Interpersonal Safety: Not on file   Stress: Not on file   Intimate Partner Violence: Not on file   Depression: Not on file   Social Connections: Not on file       Would you be willing to receive help with any of the needs that you have identified today? Not applicable       Camillo Flaming  [facility] Specialty Pharmacist

## 2021-06-28 NOTE — Unmapped (Signed)
[facility] Specialty Medication Onboarding    Specialty Medication: PRALUENT PEN 150 mg/mL subcutaneous injection (alirocumab)  Prior Authorization: Approved   Financial Assistance: Yes - copay card approved as secondary   Final Copay/Day Supply: $35 / 57    Insurance Restrictions: None     Notes to Pharmacist:     The triage team has completed the benefits investigation and has determined that the patient is able to fill this medication at [facility]. Please contact the patient to complete the onboarding or follow up with the prescribing physician as needed.

## 2021-06-29 MED FILL — EMPTY CONTAINER: 120 days supply | Qty: 1 | Fill #0

## 2021-07-17 ENCOUNTER — Other Ambulatory Visit: Payer: Self-pay | Admitting: Family Medicine

## 2021-07-17 DIAGNOSIS — K219 Gastro-esophageal reflux disease without esophagitis: Secondary | ICD-10-CM

## 2021-07-17 NOTE — Telephone Encounter (Signed)
Medication Refill - Medication: omeprazole (PRILOSEC) 40 MG  ? ?Has the patient contacted their pharmacy? Yes.   ?(Agent: If no, request that the patient contact the pharmacy for the refill. If patient does not wish to contact the pharmacy document the reason why and proceed with request.) ?(Agent: If yes, when and what did the pharmacy advise?) ?Rx too old/ needs to call pcp for new RX ?Preferred Pharmacy (with phone number or street name): [id number] - [place], [place]. [address]  ?[address], [place] [address]  ?Phone:  [phone] ?Has the patient been seen for an appointment in the last year OR does the patient have an upcoming appointment? Yes.   ? ?Agent: Please be advised that RX refills may take up to 3 business days. We ask that you follow-up with your pharmacy. ?

## 2021-07-18 DIAGNOSIS — Z1231 Encounter for screening mammogram for malignant neoplasm of breast: Principal | ICD-10-CM

## 2021-07-18 MED ORDER — OMEPRAZOLE 40 MG PO CPDR: DELAYED_RELEASE_CAPSULE | ORAL | 0 refills | Status: DC

## 2021-07-18 NOTE — Telephone Encounter (Signed)
Pt really needs this medication refilled, please advise.  ?

## 2021-07-18 NOTE — Telephone Encounter (Signed)
Requested medication (s) are due for refill today:   Yes this rx from 2020 ? ?Requested medication (s) are on the active medication list:   Yes ? ?Future visit scheduled:   Yes ? ? ?Last ordered: 08/03/2018 #90, 0 refills ? ?Returned because pt requesting a refill of this but needs a new rx since last time filled was in 2020.   ? ?Requested Prescriptions  ?Pending Prescriptions Disp Refills  ? omeprazole (PRILOSEC) 40 MG capsule    ?  Sig: TAKE 1 CAPSULE BY MOUTH EVERY DAY  ?  ? Gastroenterology: Proton Pump Inhibitors Passed - 07/18/2021 10:10 AM  ?  ?  Passed - Valid encounter within last 12 months  ?  Recent Outpatient Visits   ? ?      ? 2 months ago History of Graves' disease  ? [facility] Steele Sizer, MD  ? 3 months ago Diverticulitis  ? [place], PA-C  ? 8 months ago Familial hyperlipidemia, high LDL  ? [facility] [place], Drue Stager, MD  ? 1 year ago Familial hyperlipidemia, high LDL  ? [facility] Steele Sizer, MD  ? 1 year ago Palpitations  ? [facility] [place] Malkin, MD  ? ?  ?  ?Future Appointments   ? ?        ? In 3 months Steele Sizer, MD [facility] [place]  ? ?  ? ? ?  ?  ?  ? ?

## 2021-07-26 ENCOUNTER — Ambulatory Visit: Admit: 2021-07-26 | Discharge: 2021-07-26 | Payer: MEDICARE

## 2021-07-26 DIAGNOSIS — R9431 Abnormal electrocardiogram [ECG] [EKG]: Secondary | ICD-10-CM | POA: Diagnosis not present

## 2021-07-26 DIAGNOSIS — R002 Palpitations: Secondary | ICD-10-CM | POA: Diagnosis not present

## 2021-07-28 ENCOUNTER — Ambulatory Visit: Admit: 2021-07-28 | Discharge: 2021-07-29 | Payer: MEDICARE

## 2021-07-28 DIAGNOSIS — Z1231 Encounter for screening mammogram for malignant neoplasm of breast: Secondary | ICD-10-CM | POA: Diagnosis not present

## 2021-08-15 ENCOUNTER — Ambulatory Visit: Admit: 2021-08-15 | Discharge: 2021-08-16 | Payer: MEDICARE

## 2021-08-15 DIAGNOSIS — Z6826 Body mass index (BMI) 26.0-26.9, adult: Secondary | ICD-10-CM | POA: Diagnosis not present

## 2021-08-15 DIAGNOSIS — R002 Palpitations: Secondary | ICD-10-CM | POA: Diagnosis not present

## 2021-08-15 DIAGNOSIS — E7849 Other hyperlipidemia: Secondary | ICD-10-CM | POA: Diagnosis not present

## 2021-08-15 LAB — LDL CHOLESTEROL, DIRECT: LDL CHOLESTEROL DIRECT: 126 mg/dL

## 2021-08-15 MED ORDER — METOPROLOL TARTRATE 25 MG TABLET
ORAL_TABLET | Freq: Two times a day (BID) | ORAL | 3 refills | 90 days | Status: CP
Start: 2021-08-15 — End: ?

## 2021-08-15 NOTE — Unmapped (Signed)
DIVISION OF CARDIOLOGY   [facility], [place]                                                                         Date of Service:  08/15/2021   Assessment/Plan   1. Familial hyperlipidemia, high LDL  Has been taking Praluent without difficulty.  We will recheck her LDL today to ensure improvement in overall cholesterol.  - LDL Cholesterol, Direct; Future    2. Palpitations  Zio patch was just turned in 5 days ago and is not available for review.  It sounds that overall patient's symptoms is improved on metoprolol but we await the results of her Zio patch.  I will follow-up and then contact her via MyChart about the results.  She will follow-up in 4 months.      Return to clinic:  Return in about 4 months (around 12/15/2021).      Subjective:   [id number] Adrienne Ready, MD  Chief complaint:  66 y.o. female with a history of Graves disease is referred by Dr. Alba Cory for evaluation of palpitations.     History of Present Illness:  Patient was seen by her PCP on 05/11/21 reporting palpitations. She had seen cardiologist at [facility] in the past and was diagnosed with occasional PVC's and PAC's in 2017. Referred to cardiology for reevaluation.     Patient presents today for follow up reporting increased episodes of racing heart beats. Her episodes typically last a few days but her current episode has been intermittent for the past month.  She started metoprolol 1 month ago with some improvement. Patient tried cutting her dose in half but did not tolerate this due to palpitations. She previously experienced lightheadedness with her episodes but this has not occurred since starting metoprolol. No chest pain, shortness of breath, emesis, or dizziness. Patient has some nausea which she attributes to reflux. She has not drank caffeinated coffee for the past year due to worsening of her palpitations. Patient drinks soda or eats chocolate on occasion.     Patient also cannot tolerate statins, recently tried zetia but developed cramping. She does note ongoing leg cramping despite not being on any medications. No tobacco, alcohol, or recreational drug use. Her mother had CAD but could not tolerate statins, had an MI and died from CHF at 25 a few months later. Her father died from liver cancer.  Her daughter has elevated cholesterol but her sons' cholesterol has been normal.     She presents today after having had  had a normal CRP sed rate and CK at her last clinic appointment.  Additionally echocardiogram was normal.  Zio patch revealed has been worn and she had multiple episodes while wearing it but just turned it in 5 days ago.  She reports overall she feels the metoprolol is helping but she still has episodes where she feels a fluttering sensation in her chest and sometimes she will feel a little bit lightheaded episode associated with an irregular heartbeat.  She reports she has had no issues with the Praluent and is tolerating it quite well.  She has had 4 doses and would like to have her cholesterol rechecked.  She did discuss her lipids with all  of her children and it appears only her daughter has severely elevated lipids and is under the care of a physician for it.    Past Medical History  There is no problem list on file for this patient.      Medications:  Current Outpatient Medications   Medication Sig Dispense Refill    alirocumab (PRALUENT PEN) 150 mg/mL subcutaneous injection Inject the contents of one pen (150 mg total) under the skin every fourteen (14) days. 6 mL 3    azelastine (ASTELIN) 137 mcg (0.1 %) nasal spray 1 spray into each nostril.      empty container (SHARPS-A-GATOR DISPOSAL SYSTEM) Misc Use as directed for sharps disposal 1 each 2    omeprazole (PRILOSEC) 40 MG capsule Take 1 capsule (40 mg total) by mouth daily.      metoprolol tartrate (LOPRESSOR) 25 MG tablet Take 1 tablet (25 mg total) by mouth Two (2) times a day. 180 tablet 3     No current facility-administered medications for this visit.       Allergies  Allergies   Allergen Reactions    Aspirin Hives and Rash    Sulfa (Sulfonamide Antibiotics) Rash    Latex, Natural Rubber     Statins-Hmg-Coa Reductase Inhibitors      Muscle pain       Social History:   Social History     Tobacco Use    Smoking status: Never    Smokeless tobacco: Never       Family History:  Family History   Problem Relation Age of Onset    Heart failure Mother     Heart attack Mother 57    Liver cancer Father     Breast cancer Maternal Aunt        ROS- 12 system review is negative other than what is specified in the History of Present Illness.      Objective:   Physical Exam  Vitals:    08/15/21 1130   BP: 123/61   BP Site: R Arm   BP Position: Sitting   BP Cuff Size: Medium   Pulse: 64   SpO2: 97%   Weight: 59.5 kg (131 lb 3.2 oz)       General-  Normal appearing female in no apparent distress.  Neurologic- Alert and oriented X3.  Cranial nerve II-XII grossly intact.  HEENT-  Normocephalic atraumatic head.  No scleral icterus.  Wearing face mask.  Neck- Supple, no carotid bruis, no JVD.  Lungs- Clear to auscultation, no wheezes, rhonchi, or rhales.  Heart-  RRR, no MRG  Abdomen- Soft, nontender, no organomegally.  Extremities-  No clubbing or cyanosis. No lower extremity edema bilaterally.    Pulses- +2 pulses in radial and dorsalis pedis bilaterally.  Psych- Normal mood, appropriate.      Laboratory data:    I have personally reviewed the images of the following diagnostic studies.      Electrocardiogram:  From today, 06/20/21, showed SR with PVC.     Cardiac monitor:  From 2017 at [facility] showed an average rate of PVCs and PACs. No sustained dysrhythmia.     Lipid panel:  From 05/11/21 at [facility] showed:  Triglycerides- 114  Cholesterol- 386  HDL- 74  LDL- 287    Echocardiogram:  From 07/26/2021 showed normal LV function with aortic sclerosis.

## 2021-08-23 ENCOUNTER — Ambulatory Visit: Admit: 2021-08-23 | Discharge: 2021-08-24 | Payer: MEDICARE

## 2021-08-23 DIAGNOSIS — Z1231 Encounter for screening mammogram for malignant neoplasm of breast: Secondary | ICD-10-CM | POA: Diagnosis not present

## 2021-08-23 LAB — HM MAMMOGRAPHY

## 2021-08-29 DIAGNOSIS — R002 Palpitations: Secondary | ICD-10-CM | POA: Diagnosis not present

## 2021-09-11 NOTE — Unmapped (Signed)
[facility] Clinical Assessment & Refill Coordination Note    Adrienne Roberson, DOB: 1955-11-04  Phone: [phone] (home)     All above HIPAA information was verified with patient.     Was a Nurse, learning disability used for this call? No    Specialty Medication(s):   General Specialty: Praluent     Current Outpatient Medications   Medication Sig Dispense Refill    alirocumab (PRALUENT PEN) 150 mg/mL subcutaneous injection Inject the contents of one pen (150 mg total) under the skin every fourteen (14) days. 6 mL 3    azelastine (ASTELIN) 137 mcg (0.1 %) nasal spray 1 spray into each nostril.      empty container (SHARPS-A-GATOR DISPOSAL SYSTEM) Misc Use as directed for sharps disposal 1 each 2    metoprolol tartrate (LOPRESSOR) 25 MG tablet Take 1 tablet (25 mg total) by mouth Two (2) times a day. 180 tablet 3    omeprazole (PRILOSEC) 40 MG capsule Take 1 capsule (40 mg total) by mouth daily.       No current facility-administered medications for this visit.        Changes to medications: Adrienne Roberson reports no changes at this time.    Allergies   Allergen Reactions    Aspirin Hives and Rash    Sulfa (Sulfonamide Antibiotics) Rash    Latex, Natural Rubber     Statins-Hmg-Coa Reductase Inhibitors      Muscle pain       Changes to allergies: No    SPECIALTY MEDICATION ADHERENCE     Praluent 150 mg/ml: 12 days of medicine on hand       Medication Adherence    Patient reported X missed doses in the last month: 0  Specialty Medication: Praluent 150mg /mL  Informant: patient          Specialty medication(s) dose(s) confirmed: Regimen is correct and unchanged.     Are there any concerns with adherence? No    Adherence counseling provided? Not needed    CLINICAL MANAGEMENT AND INTERVENTION      Clinical Benefit Assessment:    Do you feel the medicine is effective or helping your condition? Yes    Clinical Benefit counseling provided? Labs from 08/15/21 show evidence of clinical benefit    Adverse Effects Assessment:    Are you experiencing any side effects? No    Are you experiencing difficulty administering your medicine? No    Quality of Life Assessment:       How many days over the past month did your familial hyperlipidemia  keep you from your normal activities? For example, brushing your teeth or getting up in the morning. Patient declined to answer    Have you discussed this with your provider? Not needed    Acute Infection Status:    Acute infections noted within Epic:  No active infections  Patient reported infection: None    Therapy Appropriateness:    Is therapy appropriate and patient progressing towards therapeutic goals? Yes, therapy is appropriate and should be continued    DISEASE/MEDICATION-SPECIFIC INFORMATION      For patients on injectable medications: Patient currently has 0 doses left.  Next injection is scheduled for 09/23/21.    PATIENT SPECIFIC NEEDS     Does the patient have any physical, cognitive, or cultural barriers? No    Is the patient high risk? No    Does the patient require a Care Management Plan? No     SOCIAL DETERMINANTS OF HEALTH  At the [facility], we have learned that life circumstances - like trouble affording food, housing, utilities, or transportation can affect the health of many of our patients.   That is why we wanted to ask: are you currently experiencing any life circumstances that are negatively impacting your health and/or quality of life? Patient declined to answer    Social Determinants of Health     Financial Resource Strain: Not on file   Internet Connectivity: Not on file   Food Insecurity: Not on file   Tobacco Use: Low Risk     Smoking Tobacco Use: Never    Smokeless Tobacco Use: Never    Passive Exposure: Not on file   Housing/Utilities: Not on file   Alcohol Use: Not on file   Transportation Needs: Not on file   Substance Use: Not on file   Health Literacy: Not on file   Physical Activity: Not on file   Interpersonal Safety: Not on file   Stress: Not on file   Intimate Partner Violence: Not on file   Depression: Not on file   Social Connections: Not on file       Would you be willing to receive help with any of the needs that you have identified today? Not applicable       SHIPPING     Specialty Medication(s) to be Shipped:   General Specialty: Praluent    Other medication(s) to be shipped: No additional medications requested for fill at this time     Changes to insurance: No    Delivery Scheduled: Yes, Expected medication delivery date: 09/14/21.     Medication will be delivered via Same Day Courier to the confirmed prescription address in [facility].    The patient will receive a drug information handout for each medication shipped and additional FDA Medication Guides as required.  Verified that patient has previously received a Conservation officer, historic buildings and a Surveyor, mining.    The patient or caregiver noted above participated in the development of this care plan and knows that they can request review of or adjustments to the care plan at any time.      All of the patient's questions and concerns have been addressed.    Camillo Flaming   [facility] Specialty Pharmacist

## 2021-09-13 DIAGNOSIS — E7849 Other hyperlipidemia: Principal | ICD-10-CM

## 2021-09-13 MED ORDER — EVOLOCUMAB 140 MG/ML SUBCUTANEOUS PEN INJECTOR
SUBCUTANEOUS | 3 refills | 0 days | Status: CP
Start: 2021-09-13 — End: ?
  Filled 2021-09-15: qty 2, 28d supply, fill #0

## 2021-09-14 DIAGNOSIS — E7849 Other hyperlipidemia: Principal | ICD-10-CM

## 2021-09-14 NOTE — Unmapped (Signed)
Adrienne Roberson 's Praluent shipment will be canceled  as a result of drug is no longer on insurance formulary.     I have reached out to the patient  and communicated the delay. We will not reschedule the medication and have removed this/these medication(s) from the work request.  We have canceled this work request.

## 2021-09-14 NOTE — Unmapped (Signed)
[facility] Specialty Medication Onboarding    Specialty Medication: REPATHA SURECLICK 140 mg/mL Pnij (evolocumab)  Prior Authorization: Not Required   Financial Assistance: Yes - copay card approved as secondary   Final Copay/Day Supply: $15 / 5884    Insurance Restrictions: None     Notes to Pharmacist: Switch from Praluent    The triage team has completed the benefits investigation and has determined that the patient is able to fill this medication at [facility]. Please contact the patient to complete the onboarding or follow up with the prescribing physician as needed.

## 2021-09-14 NOTE — Unmapped (Signed)
*Switching from Praluent to Repatha due to insurance preference*    [facility]   Patient Onboarding/Medication Counseling    Adrienne Roberson is a 66 y.o. female with familial hyperlipidemia who I am counseling today on initiation of therapy.  I am speaking to the patient.    Was a Nurse, learning disability used for this call? No    Verified patient's date of birth / HIPAA.    Specialty medication(s) to be sent: General Specialty: Repatha      Non-specialty medications/supplies to be sent: none at this time      Medications not needed at this time: n/a         Repatha (evolocumab)    Medication & Administration     Dosage: Inject the contents of 1 pen (140mg ) under the skin every 2 weeks.    Administration: Administer under the skin of the abdomen, thigh or upper arm. Rotate sites with each injection.  Injection instructions - Autoinjector:  Remove 1 Repatha autoinjector from the refrigerator and let stand at room temperature for at least 30 minutes.  Check the autoinjector for the following:  Expiration date  Absence of any cracks or damage  The medicine is clear and colorless and does not contain any particles  The orange cap is present and securely attached  Choose your injection site and clean with an alcohol wipe. Allow to air dry completely.  Pull the orange cap straight off and discard  Pinch the skin (or stretch) with your thumb and fingers creating an area 2 inches wide  Maintaining the pinch (or stretch) press the pen to your skin at a 90 degree angle. Firmly push the autoinjector down until the skin stops moving and the yellow safety guard is no longer visible.  Do not touch the gray start button yet  When you are ready to inject, press the gray start button. You will hear a click that signals the start of the injection  Continue to press the pen to your skin and lift your thumb  The injection may take up to 15 seconds. You will know the injection is complete when the medication window turns yellow. You may also hear a second click.  Remove the pen from your skin and discard the pen in a sharps container.  If there is blood at the injection site, press a cotton ball or gauze to the site. Do not rub the injection site.    Adherence/Missed dose instructions: Administer a missed dose within 7 days and resume your normal schedule.  If it has been more than 7 days and you inject every 2 weeks, skip the missed dose and resume your normal schedule..     Goals of Therapy     Lower cholesterol, prevention of cardiovascular events in patients with established cardiovascular disease    Side Effects & Monitoring Parameters   Flu-like symptoms  Signs of a common cold  Back pain  Injection site irritation  Nose or throat irritation    The following side effects should be reported to the provider:  Signs of an allergic reaction  Signs of high blood sugar (confusion, drowsiness, increase thirst/hunger/urination, fast breathing, flushing)      Contraindications, Warnings, & Precautions     Latex (the packaging of Repatha may contain natural rubber). Adrienne Roberson reports she has a very mild contact allergy to latex and only has issues with prolonged contact. She will monitor for any reaction with Repatha.     Drug/Food Interactions  Medication list reviewed in Epic. The patient was instructed to inform the care team before taking any new medications or supplements. No drug interactions identified.     Storage, Handling Precautions, & Disposal   Repatha should be stored in the refrigerator. If necessary, Repatha may be kept at room temperature for no more than 30 days.  Place used devices in a sharps container for disposal.      Current Medications (including OTC/herbals), Comorbidities and Allergies     Current Outpatient Medications   Medication Sig Dispense Refill    azelastine (ASTELIN) 137 mcg (0.1 %) nasal spray 1 spray into each nostril.      empty container (SHARPS-A-GATOR DISPOSAL SYSTEM) Misc Use as directed for sharps disposal 1 each 2    evolocumab 140 mg/mL PnIj Inject the contents of one pen (140 mg) under the skin every fourteen (14) days. 6 mL 3    metoprolol tartrate (LOPRESSOR) 25 MG tablet Take 1 tablet (25 mg total) by mouth Two (2) times a day. 180 tablet 3    omeprazole (PRILOSEC) 40 MG capsule Take 1 capsule (40 mg total) by mouth daily.       No current facility-administered medications for this visit.       Allergies   Allergen Reactions    Aspirin Hives and Rash    Sulfa (Sulfonamide Antibiotics) Rash    Latex, Natural Rubber     Statins-Hmg-Coa Reductase Inhibitors      Muscle pain       There is no problem list on file for this patient.      Reviewed and up to date in Epic.    Appropriateness of Therapy     Acute infections noted within Epic:  No active infections  Patient reported infection: None    Is medication and dose appropriate based on diagnosis and infection status? Yes    Prescription has been clinically reviewed: Yes      Baseline Quality of Life Assessment      How many days over the past month did your familial hyperlipidemia  keep you from your normal activities? For example, brushing your teeth or getting up in the morning. Patient declined to answer    Financial Information     Medication Assistance provided: Prior Authorization and Copay Assistance    Anticipated copay of $5 (28 days) reviewed with patient. Verified delivery address.    Delivery Information     Scheduled delivery date: 09/15/21    Expected start date: 09/23/21    Medication will be delivered via Same Day Courier to the prescription address in [facility]  This shipment will not require a signature.      Explained the services we provide at [facility] and that each month we would call to set up refills.  Stressed importance of returning phone calls so that we could ensure they receive their medications in time each month.  Informed patient that we should be setting up refills 7-10 days prior to when they will run out of medication.  A pharmacist will reach out to perform a clinical assessment periodically.  Informed patient that a welcome packet, containing information about our pharmacy and other support services, a Notice of Privacy Practices, and a drug information handout will be sent.      The patient or caregiver noted above participated in the development of this care plan and knows that they can request review of or adjustments to the care plan at any  time.      Patient or caregiver verbalized understanding of the above information as well as how to contact the pharmacy at [phone] option 4 with any questions/concerns.  The pharmacy is open Monday through Friday 8:30am-4:30pm.  A pharmacist is available 24/7 via pager to answer any clinical questions they may have.    Patient Specific Needs     Does the patient have any physical, cognitive, or cultural barriers? No    Does the patient have adequate living arrangements? (i.e. the ability to store and take their medication appropriately) Yes    Did you identify any home environmental safety or security hazards? No    Patient prefers to have medications discussed with  Patient     Is the patient or caregiver able to read and understand education materials at a high school level or above? Yes    Patient's primary language is  English     Is the patient high risk? No    SOCIAL DETERMINANTS OF HEALTH     At the [facility], we have learned that life circumstances - like trouble affording food, housing, utilities, or transportation can affect the health of many of our patients.   That is why we wanted to ask: are you currently experiencing any life circumstances that are negatively impacting your health and/or quality of life? Patient declined to answer    Social Determinants of Health     Financial Resource Strain: Not on file   Internet Connectivity: Not on file   Food Insecurity: Not on file   Tobacco Use: Low Risk     Smoking Tobacco Use: Never    Smokeless Tobacco Use: Never    Passive Exposure: Not on file   Housing/Utilities: Not on file   Alcohol Use: Not on file   Transportation Needs: Not on file   Substance Use: Not on file   Health Literacy: Not on file   Physical Activity: Not on file   Interpersonal Safety: Not on file   Stress: Not on file   Intimate Partner Violence: Not on file   Depression: Not on file   Social Connections: Not on file       Would you be willing to receive help with any of the needs that you have identified today? Not applicable       Camillo Flaming  [facility] Specialty Pharmacist

## 2021-10-10 NOTE — Unmapped (Signed)
[facility] Clinical Assessment & Refill Coordination Note    Adrienne Roberson, DOB: 1955-09-01  Phone: [phone] (home)     All above HIPAA information was verified with patient.     Was a Nurse, learning disability used for this call? No    Specialty Medication(s):   General Specialty: Repatha     Current Outpatient Medications   Medication Sig Dispense Refill    azelastine (ASTELIN) 137 mcg (0.1 %) nasal spray 1 spray into each nostril.      empty container (SHARPS-A-GATOR DISPOSAL SYSTEM) Misc Use as directed for sharps disposal 1 each 2    evolocumab 140 mg/mL PnIj Inject the contents of one pen (140 mg) under the skin every fourteen (14) days. 6 mL 3    metoprolol tartrate (LOPRESSOR) 25 MG tablet Take 1 tablet (25 mg total) by mouth Two (2) times a day. 180 tablet 3    omeprazole (PRILOSEC) 40 MG capsule Take 1 capsule (40 mg total) by mouth daily.       No current facility-administered medications for this visit.        Changes to medications: Evvy reports no changes at this time.    Allergies   Allergen Reactions    Aspirin Hives and Rash    Sulfa (Sulfonamide Antibiotics) Rash    Latex, Natural Rubber     Statins-Hmg-Coa Reductase Inhibitors      Muscle pain       Changes to allergies: No    SPECIALTY MEDICATION ADHERENCE     REPATHA 140 mg/ml: 0 days of medicine on hand     Medication Adherence    Patient reported X missed doses in the last month: 0  Specialty Medication: REPATHA  Patient is on additional specialty medications: No  Informant: patient  Reliability of informant: reliable  Patient is at risk for Non-Adherence: No  Reasons for non-adherence: no problems identified                            Specialty medication(s) dose(s) confirmed: Regimen is correct and unchanged.     Are there any concerns with adherence? No    Adherence counseling provided? Not needed    CLINICAL MANAGEMENT AND INTERVENTION      Clinical Benefit Assessment:    Do you feel the medicine is effective or helping your condition? Yes    Clinical Benefit counseling provided? Not needed    Adverse Effects Assessment:    Are you experiencing any side effects? No; switched from Praluent last month and requested a month only to assess for problems. No problems reported so she wishes to get 3 month supply each fill now.    Are you experiencing difficulty administering your medicine? No    Quality of Life Assessment:    How many days over the past month did your cv disease  keep you from your normal activities? For example, brushing your teeth or getting up in the morning. Patient declined to answer    Have you discussed this with your provider? Not needed    Acute Infection Status:    Acute infections noted within Epic:  No active infections  Patient reported infection: None    Therapy Appropriateness:    Is therapy appropriate and patient progressing towards therapeutic goals? Yes, therapy is appropriate and should be continued    DISEASE/MEDICATION-SPECIFIC INFORMATION      For patients on injectable medications: Patient currently has 0 doses  left.  Next injection is scheduled for 10/21/21.    PATIENT SPECIFIC NEEDS     Does the patient have any physical, cognitive, or cultural barriers? No    Is the patient high risk? No    Does the patient require a Care Management Plan? No     SOCIAL DETERMINANTS OF HEALTH     At the [facility], we have learned that life circumstances - like trouble affording food, housing, utilities, or transportation can affect the health of many of our patients.   That is why we wanted to ask: are you currently experiencing any life circumstances that are negatively impacting your health and/or quality of life? Patient declined to answer    Social Determinants of Health     Financial Resource Strain: Not on file   Internet Connectivity: Not on file   Food Insecurity: Not on file   Tobacco Use: Low Risk  (08/15/2021)    Patient History     Smoking Tobacco Use: Never     Smokeless Tobacco Use: Never     Passive Exposure: Not on file   Housing/Utilities: Not on file   Alcohol Use: Not on file   Transportation Needs: Not on file   Substance Use: Not on file   Health Literacy: Not on file   Physical Activity: Not on file   Interpersonal Safety: Not on file   Stress: Not on file   Intimate Partner Violence: Not on file   Depression: Not on file   Social Connections: Not on file       Would you be willing to receive help with any of the needs that you have identified today? Not applicable       SHIPPING     Specialty Medication(s) to be Shipped:   General Specialty: Repatha    Other medication(s) to be shipped: No additional medications requested for fill at this time     Changes to insurance: No    Delivery Scheduled: Yes, Expected medication delivery date: 10/17/21.     Medication will be delivered via Same Day Courier to the confirmed prescription address in [facility].    The patient will receive a drug information handout for each medication shipped and additional FDA Medication Guides as required.  Verified that patient has previously received a Conservation officer, historic buildings and a Surveyor, mining.    The patient or caregiver noted above participated in the development of this care plan and knows that they can request review of or adjustments to the care plan at any time.      All of the patient's questions and concerns have been addressed.    Marletta Lor   [facility] Specialty Pharmacist

## 2021-10-13 ENCOUNTER — Other Ambulatory Visit: Payer: Self-pay | Admitting: Family Medicine

## 2021-10-13 DIAGNOSIS — K219 Gastro-esophageal reflux disease without esophagitis: Secondary | ICD-10-CM

## 2021-10-17 MED FILL — REPATHA SURECLICK 140 MG/ML SUBCUTANEOUS PEN INJECTOR: SUBCUTANEOUS | 84 days supply | Qty: 6 | Fill #1

## 2021-11-08 NOTE — Progress Notes (Signed)
Name: Mia Camacho   MRN: [id number]    DOB: 1955/11/28   Date:11/09/2021       Progress Note  Subjective  Chief Complaint  Follow Up  HPI  History of Graves Disease: no recent visits to Endo, off methimazole No problems lately   Palpitation:She is under the care of Dr. Julio Alm at [facility] and is taking metoprolol twice daily now to control palpitation Symptoms are controlled now    Chronic back pain: she has a long history of back pain, she has been seeing chiropractor. Symptoms intermittent for about 10 years, diagnosed with DDD by chiropractor. She states pain is dull or sharp aggravated by activity better with rest. Discussed silver sneakers and water aerobics and [place] chi . She also has some neck pain but no radiculitis   LLQ pain: she has noticed an aching or sharp pain that goes from LLQ to to her back ( straight through) not associated with change in bowel movements or urination, no fever or chills. Pain can be aggravated by movement or when she lays down on right lateral decubitus. Going on for the past 6 months . She was seen in January by Denny Peon for follow up of black stools ( likely from peptobismol) after an episode of gastroenteritis. She was feeling better and did not take antibiotics, she stopped eating nuts and the pain has improved , however still concerned about it. She had a hysterectomy in her 30's and has one ovary left. We discussed referral to GI in the past however she stopped eating nuts and symptoms resolved   Familial hyperlipidemia: she was taking Zetia, however developed cramps again and stopped medication. Cannot tolerate statin therapy. She states father died from complications of cancer at age 61 ( bile duct ), mother had an MI in her 84's . LDL went up to 287 in April 2023, she started taking Praluente and LDL done in June was down to 126, she is currently on Repatha due to insurance preferred list. Tolerating it well    AR: not problems at this time, usually symptoms  Spring and Fall. Uses Nasonex prn and stable.    Vitamin D deficiency: she is not taking otc vitamin D 2000 units , and discussed a high calcium diet   GERD: she is taking Omeprazole prn only when she eats spicy or trigger food and symptoms have been controlled   Left ear fullness : she noticed left ear pain about one month ago, she used some otc drops and pain resolved but continues to have ear fullness , advised to use nasal spray and if no improvement to call me so we can refer her to ENT  Patient Active Problem List   Diagnosis Date Noted   Diverticulitis 03/27/2021   Anxiety 09/15/2019   Myalgia due to statin 05/01/2018   GERD without esophagitis 05/01/2018   Palpitations 12/07/2015   Perennial allergic rhinitis 09/13/2014   Back pain, chronic 09/13/2014   Diverticulosis 09/13/2014   Bursitis, trochanteric 09/13/2014   Herpes 09/13/2014   Hiatal hernia 09/13/2014   H/O: hysterectomy 09/13/2014   Personal history of urinary calculi 09/13/2014   Menopause 09/13/2014   Hernia, rectovaginal 09/13/2014   Primary osteoarthritis of both knees 09/13/2014   Pelvic muscle wasting 09/13/2014   Cleft palate 09/13/2014   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/13/2014   Epicondylitis elbow, medial 03/18/2014   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   Gastroesophageal reflux disease with hiatal hernia 06/28/2009   Vitamin D deficiency 04/05/2009   Familial  hyperlipidemia, high LDL 02/10/2009   History of Graves' disease 02/10/2009    Past Surgical History:  Procedure Laterality Date   ABDOMINAL HYSTERECTOMY     bladder tact     ROTATOR CUFF REPAIR      Family History  Problem Relation Age of Onset   Hyperlipidemia Mother    Hypertension Mother    Diabetes Mother    Heart attack Mother 46   Heart failure Mother    Cancer Father     Social History   Tobacco Use   Smoking status: Never   Smokeless tobacco: Never  Substance Use Topics   Alcohol use: No    Alcohol/week: 0.0 standard  drinks of alcohol     Current Outpatient Medications:    cholecalciferol (VITAMIN D) 1000 units tablet, Take 1,000 Units by mouth as needed., Disp: , Rfl:    metoprolol tartrate (LOPRESSOR) 25 MG tablet, TAKE 1 TABLET BY MOUTH TWICE A DAY, Disp: 180 tablet, Rfl: 0   mometasone (NASONEX) 50 MCG/ACT nasal spray, PLACE 2 SPRAYS INTO THE NOSE DAILY. (Patient taking differently: Place 2 sprays into the nose as needed.), Disp: 34 each, Rfl: 1   Multiple Vitamin (MULTIVITAMIN) capsule, Take 1 capsule by mouth daily., Disp: , Rfl:    omeprazole (PRILOSEC) 40 MG capsule, TAKE 1 CAPSULE BY MOUTH EVERY DAY (Patient taking differently: as needed.), Disp: 90 capsule, Rfl: 0   REPATHA SURECLICK 140 MG/ML SOAJ, Inject into the skin., Disp: , Rfl:    tiZANidine (ZANAFLEX) 4 MG tablet, TAKE 1 TABLET (4 MG TOTAL) BY MOUTH 3 (THREE) TIMES DAILY., Disp: 90 tablet, Rfl: 0   polyethylene glycol (MIRALAX / GLYCOLAX) packet, Take 1 packet by mouth daily. (Patient not taking: Reported on 11/09/2021), Disp: , Rfl:   Allergies  Allergen Reactions   Aspirin Rash   Sulfa Antibiotics Rash   Penicillins Nausea And Vomiting   Statins     Muscle pain   Latex Rash    Other Reaction: WELTS    I personally reviewed active problem list, medication list, allergies, family history, social history, health maintenance with the patient/caregiver today.   ROS  Constitutional: Negative for fever or weight change.  Respiratory: Negative for cough and shortness of breath.   Cardiovascular: Negative for chest pain or palpitations.  Gastrointestinal: Negative for abdominal pain, no bowel changes.  Musculoskeletal: Negative for gait problem or joint swelling.  Skin: Negative for rash.  Neurological: Negative for dizziness or headache.  No other specific complaints in a complete review of systems (except as listed in HPI above).   Objective  Vitals:   11/09/21 1009  BP: 114/66  Pulse: 88  Resp: 16  SpO2: 96%  Weight: 135  lb (61.2 kg)  Height: 4\' 10"  (1.473 m)    Body mass index is 28.22 kg/m.  Physical Exam  Constitutional: Patient appears well-developed and well-nourished.  No distress.  HEENT: head atraumatic, normocephalic, pupils equal and reactive to light, ears scarring on right TM, left side normal , neck supple, throat within normal limits Cardiovascular: Normal rate, regular rhythm and normal heart sounds.  No murmur heard. No BLE edema. Pulmonary/Chest: Effort normal and breath sounds normal. No respiratory distress. Abdominal: Soft.  There is no tenderness. Psychiatric: Patient has a normal mood and affect. behavior is normal. Judgment and thought content normal.    PHQ2/9:    11/09/2021   10:08 AM 05/11/2021   10:23 AM 03/27/2021    8:05 AM 10/27/2020   10:41 AM 04/05/2020  9:03 AM  Depression screen PHQ 2/9  Decreased Interest 0 0 0 0 0  Down, Depressed, Hopeless 0 0 0 0 0  PHQ - 2 Score 0 0 0 0 0  Altered sleeping 0 0 0    Tired, decreased energy 0 0 0    Change in appetite 0 0 0    Feeling bad or failure about yourself  0 0 0    Trouble concentrating 0 0 0    Moving slowly or fidgety/restless 0 0 0    Suicidal thoughts 0 0 0    PHQ-9 Score 0 0 0    Difficult doing work/chores  Not difficult at all Not difficult at all      phq 9 is negative   Fall Risk:    11/09/2021   10:08 AM 05/11/2021   10:22 AM 03/27/2021    8:05 AM 10/27/2020   10:40 AM 04/05/2020    9:03 AM  Fall Risk   Falls in the past year? 0 0 0 0 0  Number falls in past yr: 0 0 0 0 0  Injury with Fall? 0 0 0 0 0  Risk for fall due to : No Fall Risks No Fall Risks No Fall Risks No Fall Risks   Follow up Falls prevention discussed Falls prevention discussed Falls prevention discussed Falls prevention discussed       Functional Status Survey: Is the patient deaf or have difficulty hearing?: Yes Does the patient have difficulty seeing, even when wearing glasses/contacts?: No Does the patient have difficulty  concentrating, remembering, or making decisions?: No Does the patient have difficulty walking or climbing stairs?: No Does the patient have difficulty dressing or bathing?: No Does the patient have difficulty doing errands alone such as visiting a doctor's office or shopping?: No    Assessment & Plan  1. Palpitation  Doing well on beta-blocker   2. Perennial allergic rhinitis   3. Chronic bilateral low back pain without sciatica  Sees chiropractor  4. History of Graves' disease  In remission   5. GERD without esophagitis  Taking PPI prn and doing well   6. Vitamin D deficiency   7. Need for immunization against influenza  - Flu Vaccine QUAD High Dose(Fluad)    8. Familial hyperlipidemia, high LDL  On PCSK9 and doing well

## 2021-11-09 ENCOUNTER — Ambulatory Visit (INDEPENDENT_AMBULATORY_CARE_PROVIDER_SITE_OTHER): Payer: Medicare Other | Admitting: Family Medicine

## 2021-11-09 ENCOUNTER — Encounter: Payer: Self-pay | Admitting: Family Medicine

## 2021-11-09 VITALS — BP 114/66 | HR 88 | Resp 16 | Ht <= 58 in | Wt 135.0 lb

## 2021-11-09 DIAGNOSIS — Z8639 Personal history of other endocrine, nutritional and metabolic disease: Secondary | ICD-10-CM

## 2021-11-09 DIAGNOSIS — M545 Low back pain, unspecified: Secondary | ICD-10-CM | POA: Diagnosis not present

## 2021-11-09 DIAGNOSIS — Z23 Encounter for immunization: Secondary | ICD-10-CM

## 2021-11-09 DIAGNOSIS — J3089 Other allergic rhinitis: Secondary | ICD-10-CM

## 2021-11-09 DIAGNOSIS — E559 Vitamin D deficiency, unspecified: Secondary | ICD-10-CM

## 2021-11-09 DIAGNOSIS — K219 Gastro-esophageal reflux disease without esophagitis: Secondary | ICD-10-CM

## 2021-11-09 DIAGNOSIS — E7849 Other hyperlipidemia: Secondary | ICD-10-CM

## 2021-11-09 DIAGNOSIS — R002 Palpitations: Secondary | ICD-10-CM

## 2021-11-09 DIAGNOSIS — G8929 Other chronic pain: Secondary | ICD-10-CM

## 2021-11-09 DIAGNOSIS — M8580 Other specified disorders of bone density and structure, unspecified site: Principal | ICD-10-CM

## 2021-11-09 DIAGNOSIS — Z13820 Encounter for screening for osteoporosis: Principal | ICD-10-CM

## 2021-11-09 IMAGING — CR DG CHEST 2V
1 series · 2 of 2 positions shown · non-contrast
Comparison: 05/01/2018

CLINICAL DATA: Pt presents to ED via POV with c/o palpitations, pt
states felt like her heart beat was prolonged several times since
yesterday. Pt states took 2 doses of Metoprolol since this morning.
Pt states hx of Graves disease

EXAM:
CHEST - 2 VIEW

[Series 1: dg chest 2 view · 0.14mm/px · 2 of 2 slices shown]
[im 1/2]
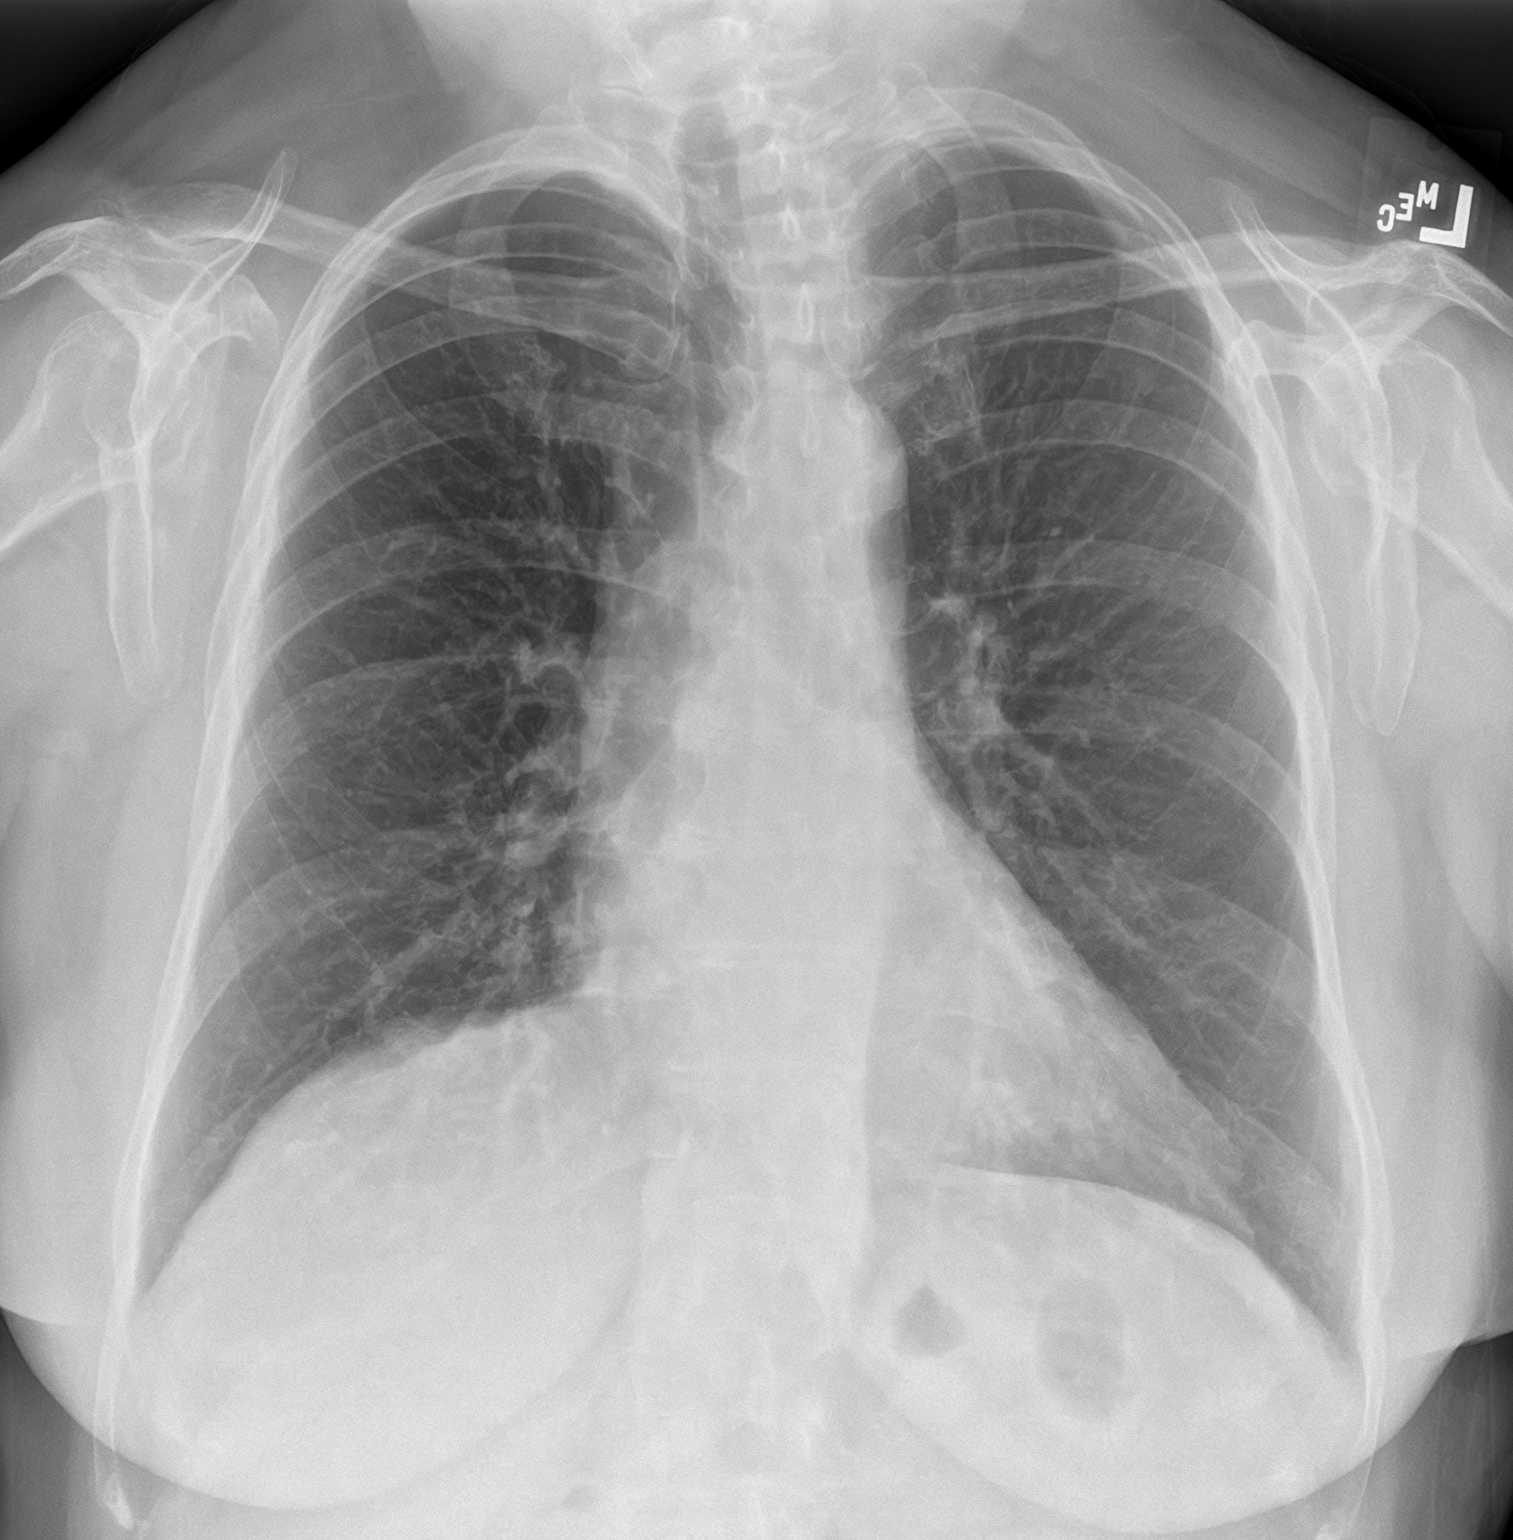
[im 2/2]
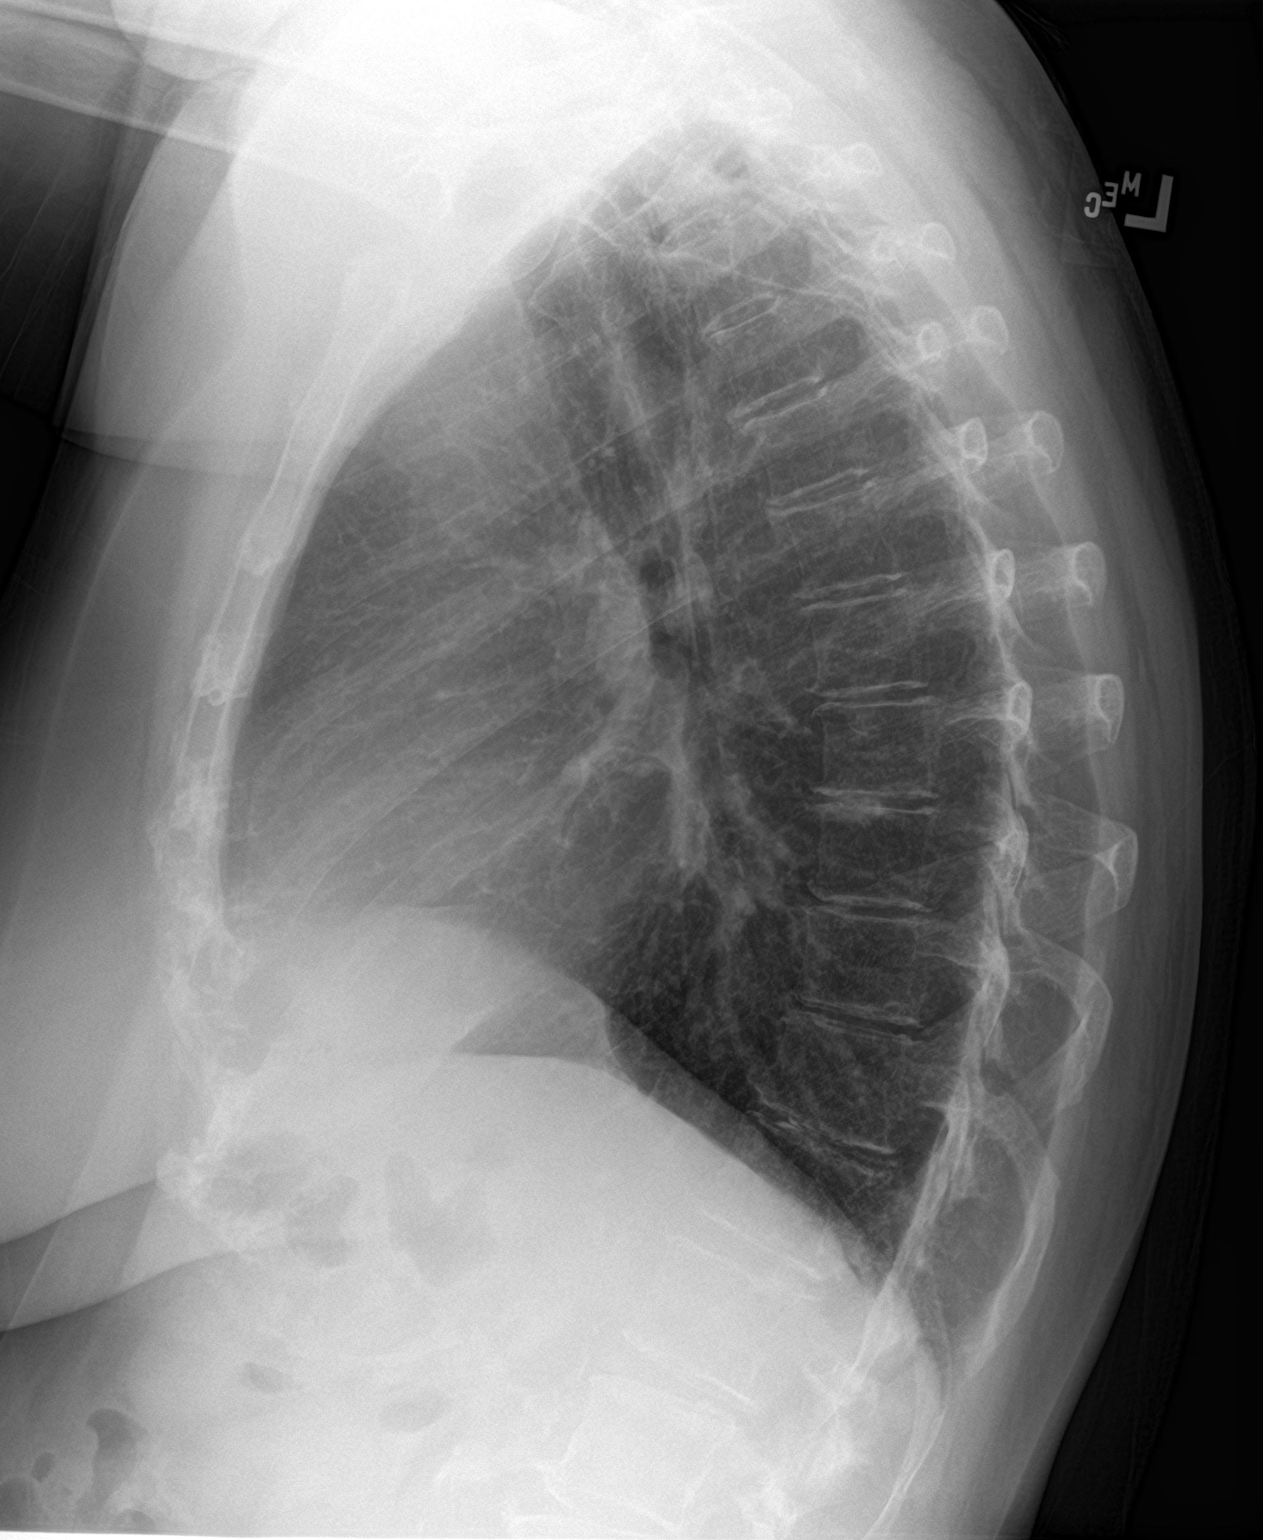

[2 of 2 positions shown; findings below may reference images not displayed]

FINDINGS: Lungs are clear.

Heart size and mediastinal contours are within normal limits.

No effusion.  No pneumothorax.

Visualized bones unremarkable.
IMPRESSION: No acute cardiopulmonary disease.

## 2021-11-09 NOTE — Patient Instructions (Signed)
Check with pharmacy end of October  COVID-19 E5  RSV

## 2022-01-04 ENCOUNTER — Ambulatory Visit: Admit: 2022-01-04 | Discharge: 2022-01-05 | Payer: MEDICARE

## 2022-01-04 DIAGNOSIS — R002 Palpitations: Secondary | ICD-10-CM | POA: Diagnosis not present

## 2022-01-04 DIAGNOSIS — E7849 Other hyperlipidemia: Secondary | ICD-10-CM | POA: Diagnosis not present

## 2022-01-04 LAB — LDL CHOLESTEROL, DIRECT: LDL CHOLESTEROL DIRECT: 131 mg/dL

## 2022-01-04 MED ORDER — EVOLOCUMAB 140 MG/ML SUBCUTANEOUS PEN INJECTOR
SUBCUTANEOUS | 3 refills | 84 days | Status: CP
Start: 2022-01-04 — End: ?
  Filled 2022-01-09: qty 6, 84d supply, fill #0

## 2022-01-04 NOTE — Unmapped (Unsigned)
Pt is here today for 6 month follow up for Hyperlipidemia.  No complaints of c/p or sob.

## 2022-01-04 NOTE — Unmapped (Signed)
[facility] Refill Coordination Note    Specialty Medication(s) to be Shipped:   General Specialty: Repatha    Other medication(s) to be shipped: No additional medications requested for fill at this time     Adrienne Roberson, DOB: 1955/05/07  Phone: [phone] (home)       All above HIPAA information was verified with patient.     Was a Nurse, learning disability used for this call? No    Completed refill call assessment today to schedule patient's medication shipment from the [facility] [phone]).  All relevant notes have been reviewed.     Specialty medication(s) and dose(s) confirmed: Regimen is correct and unchanged.   Changes to medications: Jajaira reports no changes at this time.  Changes to insurance: No  New side effects reported not previously addressed with a pharmacist or physician: None reported  Questions for the pharmacist: No    Confirmed patient received a Conservation officer, historic buildings and a Surveyor, mining with first shipment. The patient will receive a drug information handout for each medication shipped and additional FDA Medication Guides as required.       DISEASE/MEDICATION-SPECIFIC INFORMATION        For patients on injectable medications: Patient currently has 0 doses left.  Next injection is scheduled for 11/11.    SPECIALTY MEDICATION ADHERENCE     Medication Adherence    Patient reported X missed doses in the last month: 0  Specialty Medication: Repatha  Patient is on additional specialty medications: No  Any gaps in refill history greater than 2 weeks in the last 3 months: no  Demonstrates understanding of importance of adherence: yes  Informant: patient  Reliability of informant: reliable              Confirmed plan for next specialty medication refill: delivery by pharmacy  Refills needed for supportive medications: not needed              Were doses missed due to medication being on hold? No    Repatha 140 mg/ml: 0 days of medicine on hand       REFERRAL TO PHARMACIST     Referral to the pharmacist: Not needed      [facility]     Shipping address confirmed in Epic.     Delivery Scheduled: Yes, Expected medication delivery date: 11/7.     Medication will be delivered via Same Day Courier to the prescription address in Epic WAM.    Valere Dross   [facility] Specialty Technician

## 2022-01-04 NOTE — Unmapped (Signed)
DIVISION OF CARDIOLOGY   [employer], [place]                                                                         Date of Service:  01/04/2022   Assessment/Plan   1. Familial hyperlipidemia, high LDL  Last LDL from 08/15/21 was 126.0. Direct LDL study was ordered to recheck LDL. Evolocumab 140 mg/mL to be taken as directed for primary prevention.  - evolocumab 140 mg/mL PnIj; Inject the contents of one pen (140 mg) under the skin every fourteen (14) days. (Patient taking differently: Inject under the skin every fourteen (14) days.)  Dispense: 6 mL; Refill: 3  - LDL Cholesterol, Direct; Future    2. Palpitations  Has tolerated Metoprolol BID well in the past. On patients request prescription was altered to Metoprolol 25 mg, once daily, to be taken as directed for rhythm control. I believe this change is reasonable based on the patients well managed symptoms.   Return to clinic:  Return in about 1 year (around 01/05/2023).      Subjective:   ZOX:WRUEAV, Adrienne Spinner, MD  Chief complaint:  66 y.o. female with a history of Graves disease is referred by Dr. Alba Cory for evaluation of palpitations.     History of Present Illness:  Per chart review, the patient was followed by [facility] since 2017, she was diagnosed with PVCs and PACs. She was seen by her PCP on 05/11/21 reporting palpitations. Patient was seen by me on 06/20/21 at which time, the patient noted increasing frequency of elevated HR which lasts for days. Patient was on Metoprolol with some improvement, but did not tolerate the dosage. Denied chest pain, shortness of breath, emesis, or dizziness. Patient had reflux associated nausea. Patient noted unable to tolerate statins. Ziopatch, echocardiogram, CK, sed rate, and high sensitivity CRP lab studies were ordered. Patient was then seen by me on 08/15/21 at which time she reported lightheaded episode associated with an irregular heartbeat. Tolerated Praluent and Metoprolol well with occasional fluttering. At that visit, a lipid panel was ordered.    Today Adrienne Roberson presents to the clinic accompanied and ambulating without external support. The patient notes that she has been doing well since her last visit. The patient notes that he has been on Metoprolol BID and notes that she recently tried switching to once daily, taken at night, and has noted benefit from this change. She notes only transient and irregular episodes of Afib-like symptoms. She notes that these episodes have not limited her ability to perform normal activities of daily living. She notes that she has been tolerating Repatha well, noting benefit. Denies bilateral lower extremity edema, SOB, or chest pain.    Past Medical History  There is no problem list on file for this patient.      Medications:  Current Outpatient Medications   Medication Sig Dispense Refill    azelastine (ASTELIN) 137 mcg (0.1 %) nasal spray 1 spray into each nostril.      evolocumab 140 mg/mL PnIj Inject the contents of one pen (140 mg) under the skin every fourteen (14) days. (Patient taking differently: Inject under the skin every fourteen (14) days.) 6 mL 3  omeprazole (PRILOSEC) 40 MG capsule Take 1 capsule (40 mg total) by mouth daily.      empty container (SHARPS-A-GATOR DISPOSAL SYSTEM) Misc Use as directed for sharps disposal 1 each 2    metoPROLOL tartrate (LOPRESSOR) 25 MG tablet Take 1 tablet (25 mg total) by mouth nightly. 180 tablet 3     No current facility-administered medications for this visit.       Allergies  Allergies   Allergen Reactions    Aspirin Hives and Rash    Sulfa (Sulfonamide Antibiotics) Rash    Latex, Natural Rubber     Statins-Hmg-Coa Reductase Inhibitors      Muscle pain    Penicillins Nausea And Vomiting and Rash     RXN AS A CHILD       Social History:   Social History     Tobacco Use    Smoking status: Never    Smokeless tobacco: Never       Family History:  Family History   Problem Relation Age of Onset Heart failure Mother     Heart attack Mother 67    Liver cancer Father     Breast cancer Maternal Aunt        ROS- 12 system review is negative other than what is specified in the History of Present Illness.      Objective:   Physical Exam  Vitals:    01/04/22 1336   BP: 103/52   Pulse: 92   Resp: 18   Temp: 36.3 ??C (97.3 ??F)   SpO2: 95%   Weight: 62.4 kg (137 lb 8 oz)   Height: 149.9 cm (4' 11)       General-  Normal appearing female in no apparent distress.  Neurologic- Alert and oriented X3.  Cranial nerve II-XII grossly intact.  HEENT-  Normocephalic atraumatic head.  No scleral icterus.  Wearing face mask.  Neck- Supple, no carotid bruis, Flat neck veins.  Lungs- Clear to auscultation, no wheezes, rhonchi, or rhales.  Heart-  RRR. with Grade 1/6 early peaking systolic ejection murmur best heard at the upper sternal boarders with out radiations.  Abdomen- Soft, nontender, no organomegally.  Extremities-  No clubbing or cyanosis.  No pitting edema to the lower extremities bilaterally  Pulses- 2+ pulses in radial and dorsalis pedis bilaterally.  Psych- Normal mood, appropriate.     Laboratory data:    I have personally reviewed the images of the following diagnostic studies.    Electrocardiogram:    From 06/20/21 showed: Sinus rhythm with PVC.     Cardiac monitor:    From 2017 at [facility] showed: an average rate of PVCs and PACs. No sustained dysrhythmia.     Lipid panel:    Component      Latest Ref Rng 08/15/2021   LDL Direct      mg/dL 098.1          From 03/13/12 at [facility] showed:  Triglycerides- 114  Cholesterol- 386  HDL- 74  LDL- 287    Echocardiogram:  From 07/26/2021 showed normal LV function with aortic sclerosis.    Documentation assistance was provided by Mirian Capuchin, Scribe on January 04, 2022 1:49 PM  for Joneen Roach, MD.     I have reviewed the documentation provided by the scribe and confirm that it accurately reflects the service I personally performed and the decisions made by me.  Signature: CSD  Date: 01/04/22  Time: 13:49

## 2022-01-19 DIAGNOSIS — L821 Other seborrheic keratosis: Secondary | ICD-10-CM | POA: Diagnosis not present

## 2022-01-19 DIAGNOSIS — L720 Epidermal cyst: Secondary | ICD-10-CM | POA: Diagnosis not present

## 2022-01-19 DIAGNOSIS — L570 Actinic keratosis: Secondary | ICD-10-CM | POA: Diagnosis not present

## 2022-01-19 DIAGNOSIS — L723 Sebaceous cyst: Secondary | ICD-10-CM | POA: Diagnosis not present

## 2022-02-05 ENCOUNTER — Encounter: Payer: Self-pay | Admitting: Internal Medicine

## 2022-02-05 ENCOUNTER — Telehealth: Payer: Self-pay | Admitting: Family Medicine

## 2022-02-05 ENCOUNTER — Ambulatory Visit (INDEPENDENT_AMBULATORY_CARE_PROVIDER_SITE_OTHER): Payer: Medicare Other | Admitting: Internal Medicine

## 2022-02-05 VITALS — BP 124/84 | HR 104 | Temp 98.0°F | Resp 16 | Ht <= 58 in | Wt 135.7 lb

## 2022-02-05 DIAGNOSIS — J0110 Acute frontal sinusitis, unspecified: Secondary | ICD-10-CM | POA: Diagnosis not present

## 2022-02-05 MED ORDER — DOXYCYCLINE HYCLATE 100 MG PO TABS: 100.0000 mg | ORAL_TABLET | Freq: Two times a day (BID) | ORAL | 0 refills | Status: AC

## 2022-02-05 NOTE — Telephone Encounter (Signed)
Copied from CRM #[id number]. Topic: General - Other >> Feb 05, 2022  2:21 PM Dondra Prader E wrote: Reason for CRM: Pt called and reports that her PCP wants her to have a throat culture, please advise. She is willing to return to the office.   Best contact: [phone]

## 2022-02-05 NOTE — Patient Instructions (Signed)
It was great seeing you today!  Plan discussed at today's visit: -Antibiotic take twice a day for 7 days -Continue nasal spray and recommend decongestants to help with sinus pain   Follow up in: as needed  Take care and let us know if you have any questions or concerns prior to your next visit.  Dr. Caralee Ates

## 2022-02-05 NOTE — Progress Notes (Signed)
Acute Office Visit  Subjective:     Patient ID: Mia Camacho, female    DOB: 1955-03-21, 66 y.o.   MRN: [id number]  Chief Complaint  Patient presents with   Sinusitis    Onset 12 days    HPI Patient is in today for sinus infection symptoms x 12 days.  URI Compliant:  -Fever: no -Cough: no -Shortness of breath: no -Wheezing: no -Chest congestion: no -Nasal congestion: yes -Runny nose: yes -Post nasal drip: yes -Sneezing: yes -Sore throat: yes -Sinus pressure: yes -Headache: no -Face pain: yes -Ear pain: yes bilateral -Ear pressure: yes bilateral -Sick contacts: yes daughter and husband  -Context: fluctuating -Relief with OTC cold/cough medications: no  -Treatments attempted: none    Review of Systems  Constitutional:  Negative for chills and fever.  HENT:  Positive for congestion and sore throat. Negative for ear pain and sinus pain.   Respiratory:  Negative for cough, sputum production, shortness of breath and wheezing.   Neurological:  Negative for headaches.      Objective:    BP 124/84   Pulse (!) 104   Temp 98 F (36.7 C)   Resp 16   Ht 4\' 10"  (1.473 m)   Wt 135 lb 11.2 oz (61.6 kg)   SpO2 98%   BMI 28.36 kg/m  BP Readings from Last 3 Encounters:  02/05/22 124/84  11/09/21 114/66  05/11/21 112/68   Wt Readings from Last 3 Encounters:  02/05/22 135 lb 11.2 oz (61.6 kg)  11/09/21 135 lb (61.2 kg)  05/11/21 134 lb 4.8 oz (60.9 kg)      Physical Exam Constitutional:      Appearance: Normal appearance.  HENT:     Head: Normocephalic and atraumatic.     Comments: Frontal sinus pain present bilaterally to palpation     Right Ear: Ear canal and external ear normal.     Left Ear: Ear canal and external ear normal.     Ears:     Comments: Scarring present on both TM's, worse on right    Nose: Congestion present.     Mouth/Throat:     Mouth: Mucous membranes are moist.     Pharynx: Posterior oropharyngeal erythema present. No oropharyngeal  exudate.  Eyes:     Conjunctiva/sclera: Conjunctivae normal.  Cardiovascular:     Rate and Rhythm: Normal rate and regular rhythm.  Lymphadenopathy:     Cervical: No cervical adenopathy.  Skin:    General: Skin is warm and dry.  Neurological:     General: No focal deficit present.     Mental Status: She is alert. Mental status is at baseline.  Psychiatric:        Mood and Affect: Mood normal.        Behavior: Behavior normal.     No results found for any visits on 02/05/22.      Assessment & Plan:   1. Subacute frontal sinusitis: Symptoms ongoing for greater than 12 days, will treat with antibiotics.  Patient is penicillin allergic, will prescribe doxycycline 100 mg twice daily x7 days.  Patient will continue her nasal steroids and will start decongestants to help with sinus pain as well.  Patient will follow-up if symptoms worsen or fail to improve.  - doxycycline (VIBRA-TABS) 100 MG tablet; Take 1 tablet (100 mg total) by mouth 2 (two) times daily for 7 days.  Dispense: 14 tablet; Refill: 0   Return if symptoms worsen or fail to improve.  14/04/23  Rosana Berger, DO

## 2022-02-05 NOTE — Telephone Encounter (Signed)
Called and lvm letting patient know Dr. Caralee Ates already sent in an antibiotic. If in a week she is not better or worse to come in for a throat swab at that time.

## 2022-03-02 DIAGNOSIS — R002 Palpitations: Principal | ICD-10-CM

## 2022-03-21 DIAGNOSIS — R002 Palpitations: Principal | ICD-10-CM

## 2022-03-25 NOTE — Unmapped (Signed)
Adrienne Roberson requested a refill of their Repatha Sureclick via [employer]. The [facility] has scheduled delivery per the patients request via Same Day Courier to be delivered to their prescription address on 03/26/22.

## 2022-03-26 DIAGNOSIS — R002 Palpitations: Principal | ICD-10-CM

## 2022-03-26 MED ORDER — METOPROLOL SUCCINATE ER 50 MG TABLET,EXTENDED RELEASE 24 HR
ORAL_TABLET | Freq: Every day | ORAL | 2 refills | 90 days | Status: CP
Start: 2022-03-26 — End: 2023-03-26

## 2022-03-26 MED FILL — REPATHA SURECLICK 140 MG/ML SUBCUTANEOUS PEN INJECTOR: SUBCUTANEOUS | 84 days supply | Qty: 6 | Fill #1

## 2022-03-26 NOTE — Unmapped (Signed)
Pt requesting long acting Metoprolol.  Per Dr. Julio Alm, Metoprolol succinate 50 mg daily.

## 2022-05-09 NOTE — Progress Notes (Unsigned)
Name: Mia Camacho   MRN: [id number]    DOB: Dec 08, 1955   Date:05/10/2022       Progress Note  Subjective  Chief Complaint  Follow Up  HPI  History of Graves Disease: no recent visits to Endo, off methimazole No problems lately We will recheck labs   Palpitation:She is under the care of Dr. Charletta Cousin at [facility] and is taking metoprolol 50 mg XL. She has occasional dizziness, but palpitation has improved, discussed importance of staying hydrated    Chronic back pain: she has a long history of back pain, she has been seeing chiropractor. Symptoms intermittent for about 10 years, diagnosed with DDD by chiropractor. She states pain is dull or sharp aggravated by activity better with rest. Discussed silver sneakers and water aerobics and [place] chi . She also has some neck pain but no radiculitis She noticed some paresthesia on both feet over the past couple of weeks, no bowel or bladder incontinence. We will check labs   Familial hyperlipidemia: she was taking Zetia, however developed cramps again and stopped medication. Cannot tolerate statin therapy. She states father died from complications of cancer at age 64 ( bile duct ), mother had an MI in her 71's . LDL went up to 287 in April 2023, she started taking Praluente and LDL done in June was down to 126, she is currently on Repatha due to insurance preferred list, seen by cardiologist at [facility] - Dr. Charletta Cousin.    AR: not problems at this time, usually symptoms Spring and Fall. Uses Nasonex prn and stable. Unchanged    Vitamin D deficiency: she is not taking otc vitamin D 2000 units , and discussed a high calcium diet . History of osteopenia and we will repeat bone density   GERD: she is taking Omeprazole prn, cardiologist recommend to change to something else, we will try Pepcid    Patient Active Problem List   Diagnosis Date Noted   Diverticulitis 03/27/2021   Anxiety 09/15/2019   Myalgia due to statin 05/01/2018   GERD without esophagitis 05/01/2018    Palpitations 12/07/2015   Perennial allergic rhinitis 09/13/2014   Back pain, chronic 09/13/2014   Diverticulosis 09/13/2014   Bursitis, trochanteric 09/13/2014   Herpes 09/13/2014   Hiatal hernia 09/13/2014   H/O: hysterectomy 09/13/2014   Personal history of urinary calculi 09/13/2014   Menopause 09/13/2014   Hernia, rectovaginal 09/13/2014   Primary osteoarthritis of both knees 09/13/2014   Pelvic muscle wasting 09/13/2014   Cleft palate 09/13/2014   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/13/2014   Epicondylitis elbow, medial 03/18/2014   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   Gastroesophageal reflux disease with hiatal hernia 06/28/2009   Vitamin D deficiency 04/05/2009   Familial hyperlipidemia, high LDL 02/10/2009   History of Graves' disease 02/10/2009    Past Surgical History:  Procedure Laterality Date   ABDOMINAL HYSTERECTOMY     bladder tact     ROTATOR CUFF REPAIR      Family History  Problem Relation Age of Onset   Hyperlipidemia Mother    Hypertension Mother    Diabetes Mother    Heart attack Mother 39   Heart failure Mother    Cancer Father     Social History   Tobacco Use   Smoking status: Never   Smokeless tobacco: Never  Substance Use Topics   Alcohol use: No    Alcohol/week: 0.0 standard drinks of alcohol     Current Outpatient Medications:    cholecalciferol (VITAMIN D) 1000  units tablet, Take 1,000 Units by mouth as needed., Disp: , Rfl:    Magnesium 250 MG TABS, Take 250 mg by mouth daily., Disp: , Rfl:    metoprolol tartrate (LOPRESSOR) 25 MG tablet, TAKE 1 TABLET BY MOUTH TWICE A DAY (Patient taking differently: Take 50 mg by mouth daily.), Disp: 180 tablet, Rfl: 0   mometasone (NASONEX) 50 MCG/ACT nasal spray, PLACE 2 SPRAYS INTO THE NOSE DAILY., Disp: 34 each, Rfl: 1   Multiple Vitamin (MULTIVITAMIN) capsule, Take 1 capsule by mouth daily., Disp: , Rfl:    Omega-3 Fatty Acids (FISH OIL OMEGA-3 PO), Take by mouth daily., Disp: , Rfl:    omeprazole  (PRILOSEC) 40 MG capsule, TAKE 1 CAPSULE BY MOUTH EVERY DAY, Disp: 90 capsule, Rfl: 0   REPATHA SURECLICK [id number] MG/ML SOAJ, Inject into the skin., Disp: , Rfl:    tiZANidine (ZANAFLEX) 4 MG tablet, TAKE 1 TABLET (4 MG TOTAL) BY MOUTH 3 (THREE) TIMES DAILY., Disp: 90 tablet, Rfl: 0   vitamin E 200 UNIT capsule, Take 200 Units by mouth daily., Disp: , Rfl:   Allergies  Allergen Reactions   Aspirin Rash   Sulfa Antibiotics Rash   Penicillins Nausea And Vomiting   Statins     Muscle pain   Latex Rash    Other Reaction: WELTS    I personally reviewed active problem list, medication list, allergies, family history, social history, health maintenance with the patient/caregiver today.   ROS  Ten systems reviewed and is negative except as mentioned in HPI   Objective  Vitals:   05/10/22 0933  BP: 116/72  Pulse: 86  Resp: 16  SpO2: 98%  Weight: 137 lb (62.1 kg)  Height: '4\' 10"'$  (1.473 m)    Body mass index is 28.63 kg/m.  Physical Exam  Constitutional: Patient appears well-developed and well-nourished.  No distress.  HEENT: head atraumatic, normocephalic, pupils equal and reactive to light, neck supple Cardiovascular: Normal rate, regular rhythm and normal heart sounds.  No murmur heard. No BLE edema. Pulmonary/Chest: Effort normal and breath sounds normal. No respiratory distress. Abdominal: Soft.  There is no tenderness. Skin: patch on top of head that is slightly erythematous and flaky , small lesion on right arm, like a cyst  Psychiatric: Patient has a normal mood and affect. behavior is normal. Judgment and thought content normal.   PHQ2/9:    05/10/2022    9:32 AM 11/09/2021   10:08 AM 05/11/2021   10:23 AM 03/27/2021    8:05 AM 10/27/2020   10:41 AM  Depression screen PHQ 2/9  Decreased Interest 0 0 0 0 0  Down, Depressed, Hopeless 0 0 0 0 0  PHQ - 2 Score 0 0 0 0 0  Altered sleeping 0 0 0 0   Tired, decreased energy 0 0 0 0   Change in appetite 0 0 0 0   Feeling bad  or failure about yourself  0 0 0 0   Trouble concentrating 0 0 0 0   Moving slowly or fidgety/restless 0 0 0 0   Suicidal thoughts 0 0 0 0   PHQ-9 Score 0 0 0 0   Difficult doing work/chores   Not difficult at all Not difficult at all     phq 9 is negative   Fall Risk:    05/10/2022    9:31 AM 11/09/2021   10:08 AM 05/11/2021   10:22 AM 03/27/2021    8:05 AM 10/27/2020   10:40 AM  Fall  Risk   Falls in the past year? 0 0 0 0 0  Number falls in past yr: 0 0 0 0 0  Injury with Fall? 0 0 0 0 0  Risk for fall due to : No Fall Risks No Fall Risks No Fall Risks No Fall Risks No Fall Risks  Follow up Falls prevention discussed Falls prevention discussed Falls prevention discussed Falls prevention discussed Falls prevention discussed      Functional Status Survey: Is the patient deaf or have difficulty hearing?: Yes Does the patient have difficulty seeing, even when wearing glasses/contacts?: No Does the patient have difficulty concentrating, remembering, or making decisions?: No Does the patient have difficulty walking or climbing stairs?: No Does the patient have difficulty dressing or bathing?: No Does the patient have difficulty doing errands alone such as visiting a doctor's office or shopping?: No    Assessment & Plan  1. Familial hyperlipidemia, high LDL  Under the care of Dermatologist , taking Repatha  2. History of Graves' disease  - Thyroid Panel With TSH  3. GERD without esophagitis  - famotidine (PEPCID) 40 MG tablet; Take 1 tablet (40 mg total) by mouth daily as needed for heartburn or indigestion.  Dispense: 90 tablet; Refill: 0  4. Chronic bilateral low back pain without sciatica  stable  5. Vitamin D deficiency  - VITAMIN D 25 Hydroxy (Vit-D Deficiency, Fractures)  6. Perennial allergic rhinitis with seasonal variation   7. Palpitation  On beta blocker  8. Paresthesia of both feet  - B12 and Folate Panel She has chronic back pain, but symmetrical  , we will check labs first, no bowel or bladder incontinence, no weakness. Discussed side effects of lyrica and gabapentin if mild , no intervention   9. Mammogram not needed  - MM 3D SCREENING MAMMOGRAM BILATERAL BREAST; Future  10. Long-term use of high-risk medication  - COMPLETE METABOLIC PANEL WITH GFR - CBC with Differential/Platelet  11. Osteopenia after menopause  - DG Bone Density; Future  12. Scalp lesion  Over the past few weeks, advised to try topical cortisone and if no improvement follow up with dermatologist

## 2022-05-10 ENCOUNTER — Encounter: Payer: Self-pay | Admitting: Family Medicine

## 2022-05-10 ENCOUNTER — Ambulatory Visit (INDEPENDENT_AMBULATORY_CARE_PROVIDER_SITE_OTHER): Payer: Medicare Other | Admitting: Family Medicine

## 2022-05-10 VITALS — BP 116/72 | HR 86 | Resp 16 | Ht <= 58 in | Wt 137.0 lb

## 2022-05-10 DIAGNOSIS — M545 Low back pain, unspecified: Secondary | ICD-10-CM

## 2022-05-10 DIAGNOSIS — E7849 Other hyperlipidemia: Secondary | ICD-10-CM | POA: Diagnosis not present

## 2022-05-10 DIAGNOSIS — K219 Gastro-esophageal reflux disease without esophagitis: Secondary | ICD-10-CM | POA: Diagnosis not present

## 2022-05-10 DIAGNOSIS — Z538 Procedure and treatment not carried out for other reasons: Secondary | ICD-10-CM

## 2022-05-10 DIAGNOSIS — R202 Paresthesia of skin: Secondary | ICD-10-CM | POA: Diagnosis not present

## 2022-05-10 DIAGNOSIS — J3089 Other allergic rhinitis: Secondary | ICD-10-CM

## 2022-05-10 DIAGNOSIS — Z79899 Other long term (current) drug therapy: Secondary | ICD-10-CM

## 2022-05-10 DIAGNOSIS — E559 Vitamin D deficiency, unspecified: Secondary | ICD-10-CM

## 2022-05-10 DIAGNOSIS — Z23 Encounter for immunization: Secondary | ICD-10-CM

## 2022-05-10 DIAGNOSIS — G8929 Other chronic pain: Secondary | ICD-10-CM

## 2022-05-10 DIAGNOSIS — R002 Palpitations: Secondary | ICD-10-CM

## 2022-05-10 DIAGNOSIS — Z8639 Personal history of other endocrine, nutritional and metabolic disease: Secondary | ICD-10-CM

## 2022-05-10 DIAGNOSIS — J302 Other seasonal allergic rhinitis: Secondary | ICD-10-CM

## 2022-05-10 DIAGNOSIS — Z780 Asymptomatic menopausal state: Secondary | ICD-10-CM

## 2022-05-10 DIAGNOSIS — M8580 Other specified disorders of bone density and structure, unspecified site: Secondary | ICD-10-CM

## 2022-05-10 DIAGNOSIS — L989 Disorder of the skin and subcutaneous tissue, unspecified: Secondary | ICD-10-CM

## 2022-05-10 DIAGNOSIS — Z1231 Encounter for screening mammogram for malignant neoplasm of breast: Principal | ICD-10-CM

## 2022-05-10 MED ORDER — FAMOTIDINE 40 MG PO TABS: 40.0000 mg | ORAL_TABLET | Freq: Every day | ORAL | 0 refills | Status: DC | PRN

## 2022-05-11 LAB — CBC WITH DIFFERENTIAL/PLATELET
Absolute Monocytes: 548 cells/uL (ref 200–950)
Basophils Absolute: 33 cells/uL (ref 0–200)
Basophils Relative: 0.5 %
Eosinophils Absolute: 152 cells/uL (ref 15–500)
Eosinophils Relative: 2.3 %
HCT: 45.2 % — ABNORMAL HIGH (ref 35.0–45.0)
Hemoglobin: 14.9 g/dL (ref 11.7–15.5)
Lymphs Abs: 1874 cells/uL (ref 850–3900)
MCH: 29.6 pg (ref 27.0–33.0)
MCHC: 33 g/dL (ref 32.0–36.0)
MCV: 89.9 fL (ref 80.0–100.0)
MPV: 9.9 fL (ref 7.5–12.5)
Monocytes Relative: 8.3 %
Neutro Abs: 3993 cells/uL (ref 1500–7800)
Neutrophils Relative %: 60.5 %
Platelets: 329 10*3/uL (ref 140–400)
RBC: 5.03 10*6/uL (ref 3.80–5.10)
RDW: 12.8 % (ref 11.0–15.0)
Total Lymphocyte: 28.4 %
WBC: 6.6 10*3/uL (ref 3.8–10.8)

## 2022-05-11 LAB — THYROID PANEL WITH TSH
Free Thyroxine Index: 2.2 (ref 1.4–3.8)
T3 Uptake: 31 % (ref 22–35)
T4, Total: 7.2 ug/dL (ref 5.1–11.9)
TSH: 1.19 mIU/L (ref 0.40–4.50)

## 2022-05-11 LAB — COMPLETE METABOLIC PANEL WITH GFR
AG Ratio: 1.6 (calc) (ref 1.0–2.5)
ALT: 21 U/L (ref 6–29)
AST: 24 U/L (ref 10–35)
Albumin: 4.5 g/dL (ref 3.6–5.1)
Alkaline phosphatase (APISO): 78 U/L (ref 37–153)
BUN: 12 mg/dL (ref 7–25)
CO2: 27 mmol/L (ref 20–32)
Calcium: 10 mg/dL (ref 8.6–10.4)
Chloride: 103 mmol/L (ref 98–110)
Creat: 0.71 mg/dL (ref 0.50–1.05)
Globulin: 2.8 g/dL (calc) (ref 1.9–3.7)
Glucose, Bld: 89 mg/dL (ref 65–99)
Potassium: 5.2 mmol/L (ref 3.5–5.3)
Sodium: 140 mmol/L (ref 135–146)
Total Bilirubin: 0.4 mg/dL (ref 0.2–1.2)
Total Protein: 7.3 g/dL (ref 6.1–8.1)
eGFR: 94 mL/min/{1.73_m2} (ref >=60)

## 2022-05-11 LAB — B12 AND FOLATE PANEL
Folate: 21.4 ng/mL
Vitamin B-12: 646 pg/mL (ref 200–1100)

## 2022-05-11 LAB — VITAMIN D 25 HYDROXY (VIT D DEFICIENCY, FRACTURES): Vit D, 25-Hydroxy: 47 ng/mL (ref 30–100)

## 2022-06-14 DIAGNOSIS — R002 Palpitations: Principal | ICD-10-CM

## 2022-06-14 DIAGNOSIS — E7849 Other hyperlipidemia: Principal | ICD-10-CM

## 2022-06-14 MED ORDER — METOPROLOL SUCCINATE ER 50 MG TABLET,EXTENDED RELEASE 24 HR
ORAL_TABLET | Freq: Every day | ORAL | 2 refills | 90 days | Status: CP
Start: 2022-06-14 — End: 2023-06-14

## 2022-06-14 MED ORDER — EVOLOCUMAB 140 MG/ML SUBCUTANEOUS PEN INJECTOR
SUBCUTANEOUS | 3 refills | 84 days | Status: CP
Start: 2022-06-14 — End: ?
  Filled 2022-06-21: qty 6, 84d supply, fill #0

## 2022-06-14 NOTE — Unmapped (Signed)
[facility] Refill Coordination Note    Adrienne Roberson, DOB: [birth year]  Phone: [phone] (home)       All above HIPAA information was verified with patient.         06/14/2022     4:27 PM   Specialty Rx Medication Refill Questionnaire   Which Medications would you like refilled and shipped? Repatha; I have one injection left that I will use June 16, 2022   Please list all current allergies: Aspirin, sulfur drugs   Have you missed any doses in the last 30 days? No   Have you had any changes to your medication(s) since your last refill? No   How many days remaining of each medication do you have at home? 1 Repatha injection; 13 metoprolol succinate ER Tab 50mg    If receiving an injectable medication, next injection date is 06/16/2022   Have you experienced any side effects in the last 30 days? No   Please enter the full address (street address, city, state, zip code) where you would like your medication(s) to be delivered to. [address], [place], [place] [address]   Please specify on which day you would like your medication(s) to arrive. Note: if you need your medication(s) within 3 days, please call the pharmacy to schedule your order at [phone]  06/21/2022   Has your insurance changed since your last refill? No   Would you like a pharmacist to call you to discuss your medication(s)? No   Do you require a signature for your package? (Note: if we are billing Medicare Part B or your order contains a controlled substance, we will require a signature) No         Completed refill call assessment today to schedule patient's medication shipment from the [facility] [phone]).  All relevant notes have been reviewed.       Confirmed patient received a Conservation officer, historic buildings and a Surveyor, mining with first shipment. The patient will receive a drug information handout for each medication shipped and additional FDA Medication Guides as required.         REFERRAL TO PHARMACIST Referral to the pharmacist: Not needed      [facility]     Shipping address confirmed in Epic.     Delivery Scheduled:  06/21/22      Medication will be delivered via Same Day Courier to the prescription address in Epic WAM.    Moshe Salisbury   [facility] Specialty Technician

## 2022-06-15 NOTE — Unmapped (Signed)
Pt called and was receiving calla from [facility] to have her Metoprolol Succinate 50 mg mailed to her.  Pt called because she thought when she called yesterday, both RX'S were refilled.      [facility]oprolol was sent to [facility] as well as Repatha refill.  Only the Repatha refill should have been sent to [facility].  Pt will call [facility] to have Metoprolol mailed to her and I will call [facility] and have Metoprolol stopped, just mail Repatha.

## 2022-06-18 NOTE — Unmapped (Addendum)
Pt called and needs another Repatha pen injector.  She misfired her injection and needs another one.  I have called the company and they will send her another one free of charge.  [phone].  Will ship overnight after speaking to patient.  [facility] [phone]. (Pharmacy that will call her before they ship). They will fax me a form to complete to verify RX, then I will send back to them or call them verbally to verify.  Pt aware.

## 2022-06-22 NOTE — Unmapped (Signed)
I have completed the [employer] replacement questions as they requested and faxed to [employer] at [phone].

## 2022-08-17 ENCOUNTER — Ambulatory Visit (INDEPENDENT_AMBULATORY_CARE_PROVIDER_SITE_OTHER): Payer: Medicare Other

## 2022-08-17 VITALS — BP 110/68 | Ht 59.0 in | Wt 137.8 lb

## 2022-08-17 DIAGNOSIS — Z0000 Encounter for general adult medical examination without abnormal findings: Secondary | ICD-10-CM

## 2022-08-17 NOTE — Patient Instructions (Signed)
Mia Camacho , Thank you for taking time to come for your Medicare Wellness Visit. I appreciate your ongoing commitment to your health goals. Please review the following plan we discussed and let me know if I can assist you in the future.   These are the goals we discussed:  Goals   None     This is a list of the screening recommended for you and due dates:  Health Maintenance  Topic Date Due   COVID-19 Vaccine (3 - Moderna risk series) 06/10/2019   DEXA scan (bone density measurement)  Never done   Pneumonia Vaccine (1 of 1 - PCV) 05/10/2023*   Flu Shot  10/04/2022   Medicare Annual Wellness Visit  08/17/2023   Mammogram  08/24/2023   Colon Cancer Screening  09/28/2025   DTaP/Tdap/Td vaccine (3 - Td or Tdap) 08/23/2029   Hepatitis C Screening  Completed   Zoster (Shingles) Vaccine  Completed   HPV Vaccine  Aged Out  *Topic was postponed. The date shown is not the original due date.    Advanced directives: yes  Conditions/risks identified: none  Next appointment: Follow up in one year for your annual wellness visit 08/22/2023 @ 9:15am in person   Preventive Care 65 Years and Older, Female Preventive care refers to lifestyle choices and visits with your health care provider that can promote health and wellness. What does preventive care include? A yearly physical exam. This is also called an annual well check. Dental exams once or twice a year. Routine eye exams. Ask your health care provider how often you should have your eyes checked. Personal lifestyle choices, including: Daily care of your teeth and gums. Regular physical activity. Eating a healthy diet. Avoiding tobacco and drug use. Limiting alcohol use. Practicing safe sex. Taking low-dose aspirin every day. Taking vitamin and mineral supplements as recommended by your health care provider. What happens during an annual well check? The services and screenings done by your health care provider during your annual well  check will depend on your age, overall health, lifestyle risk factors, and family history of disease. Counseling  Your health care provider may ask you questions about your: Alcohol use. Tobacco use. Drug use. Emotional well-being. Home and relationship well-being. Sexual activity. Eating habits. History of falls. Memory and ability to understand (cognition). Work and work Astronomer. Reproductive health. Screening  You may have the following tests or measurements: Height, weight, and BMI. Blood pressure. Lipid and cholesterol levels. These may be checked every 5 years, or more frequently if you are over 37 years old. Skin check. Lung cancer screening. You may have this screening every year starting at age 57 if you have a 30-pack-year history of smoking and currently smoke or have quit within the past 15 years. Fecal occult blood test (FOBT) of the stool. You may have this test every year starting at age 71. Flexible sigmoidoscopy or colonoscopy. You may have a sigmoidoscopy every 5 years or a colonoscopy every 10 years starting at age 8. Hepatitis C blood test. Hepatitis B blood test. Sexually transmitted disease (STD) testing. Diabetes screening. This is done by checking your blood sugar (glucose) after you have not eaten for a while (fasting). You may have this done every 1-3 years. Bone density scan. This is done to screen for osteoporosis. You may have this done starting at age 75. Mammogram. This may be done every 1-2 years. Talk to your health care provider about how often you should have regular mammograms. Talk with  your health care provider about your test results, treatment options, and if necessary, the need for more tests. Vaccines  Your health care provider may recommend certain vaccines, such as: Influenza vaccine. This is recommended every year. Tetanus, diphtheria, and acellular pertussis (Tdap, Td) vaccine. You may need a Td booster every 10 years. Zoster  vaccine. You may need this after age 10. Pneumococcal 13-valent conjugate (PCV13) vaccine. One dose is recommended after age 9. Pneumococcal polysaccharide (PPSV23) vaccine. One dose is recommended after age 73. Talk to your health care provider about which screenings and vaccines you need and how often you need them. This information is not intended to replace advice given to you by your health care provider. Make sure you discuss any questions you have with your health care provider. Document Released: 03/18/2015 Document Revised: 11/09/2015 Document Reviewed: 12/21/2014 Elsevier Interactive Patient Education  2017 [place] Prevention in the Home Falls can cause injuries. They can happen to people of all ages. There are many things you can do to make your home safe and to help prevent falls. What can I do on the outside of my home? Regularly fix the edges of walkways and driveways and fix any cracks. Remove anything that might make you trip as you walk through a door, such as a raised step or threshold. Trim any bushes or trees on the path to your home. Use bright outdoor lighting. Clear any walking paths of anything that might make someone trip, such as rocks or tools. Regularly check to see if handrails are loose or broken. Make sure that both sides of any steps have handrails. Any raised decks and porches should have guardrails on the edges. Have any leaves, snow, or ice cleared regularly. Use sand or salt on walking paths during winter. Clean up any spills in your garage right away. This includes oil or grease spills. What can I do in the bathroom? Use night lights. Install grab bars by the toilet and in the tub and shower. Do not use towel bars as grab bars. Use non-skid mats or decals in the tub or shower. If you need to sit down in the shower, use a plastic, non-slip stool. Keep the floor dry. Clean up any water that spills on the floor as soon as it happens. Remove  soap buildup in the tub or shower regularly. Attach bath mats securely with double-sided non-slip rug tape. Do not have throw rugs and other things on the floor that can make you trip. What can I do in the bedroom? Use night lights. Make sure that you have a light by your bed that is easy to reach. Do not use any sheets or blankets that are too big for your bed. They should not hang down onto the floor. Have a firm chair that has side arms. You can use this for support while you get dressed. Do not have throw rugs and other things on the floor that can make you trip. What can I do in the kitchen? Clean up any spills right away. Avoid walking on wet floors. Keep items that you use a lot in easy-to-reach places. If you need to reach something above you, use a strong step stool that has a grab bar. Keep electrical cords out of the way. Do not use floor polish or wax that makes floors slippery. If you must use wax, use non-skid floor wax. Do not have throw rugs and other things on the floor that can make you trip.  What can I do with my stairs? Do not leave any items on the stairs. Make sure that there are handrails on both sides of the stairs and use them. Fix handrails that are broken or loose. Make sure that handrails are as long as the stairways. Check any carpeting to make sure that it is firmly attached to the stairs. Fix any carpet that is loose or worn. Avoid having throw rugs at the top or bottom of the stairs. If you do have throw rugs, attach them to the floor with carpet tape. Make sure that you have a light switch at the top of the stairs and the bottom of the stairs. If you do not have them, ask someone to add them for you. What else can I do to help prevent falls? Wear shoes that: Do not have high heels. Have rubber bottoms. Are comfortable and fit you well. Are closed at the toe. Do not wear sandals. If you use a stepladder: Make sure that it is fully opened. Do not climb a  closed stepladder. Make sure that both sides of the stepladder are locked into place. Ask someone to hold it for you, if possible. Clearly mark and make sure that you can see: Any grab bars or handrails. First and last steps. Where the edge of each step is. Use tools that help you move around (mobility aids) if they are needed. These include: Canes. Walkers. Scooters. Crutches. Turn on the lights when you go into a dark area. Replace any light bulbs as soon as they burn out. Set up your furniture so you have a clear path. Avoid moving your furniture around. If any of your floors are uneven, fix them. If there are any pets around you, be aware of where they are. Review your medicines with your doctor. Some medicines can make you feel dizzy. This can increase your chance of falling. Ask your doctor what other things that you can do to help prevent falls. This information is not intended to replace advice given to you by your health care provider. Make sure you discuss any questions you have with your health care provider. Document Released: 12/16/2008 Document Revised: 07/28/2015 Document Reviewed: 03/26/2014 Elsevier Interactive Patient Education  2017 [employer].

## 2022-08-17 NOTE — Progress Notes (Cosign Needed Addendum)
Subjective:   Mia Camacho is a 67 y.o. female who presents for an Initial Medicare Annual Wellness Visit.  Review of Systems    Cardiac Risk Factors include: advanced age (>90men, >71 women) \    Objective:    Today's Vitals   08/17/22 0851  BP: 110/68  Weight: 137 lb 12.8 oz (62.5 kg)  Height: 4\' 11"  (1.499 m)   Body mass index is 27.83 kg/m.     08/17/2022    9:01 AM 08/21/2019    1:34 PM 05/25/2016    8:22 AM 11/25/2015    8:08 AM 10/25/2015    3:49 PM 07/22/2015    8:42 AM 04/22/2015    9:21 AM  Advanced Directives  Does Patient Have a Medical Advance Directive? Yes Yes Yes Yes Yes Yes No  Type of Estate agent of [place];Living will Healthcare Power of [place];Living will Healthcare Power of [place];Living will Healthcare Power of [place];Living will Living will;Healthcare Power of [employer] Power of [place];Living will   Does patient want to make changes to medical advance directive?     No - Patient declined    Copy of Healthcare Power of Attorney in Chart?    No - copy requested No - copy requested No - copy requested   Would patient like information on creating a medical advance directive?       No - patient declined information    Current Medications (verified) Outpatient Encounter Medications as of 08/17/2022  Medication Sig   cholecalciferol (VITAMIN D) 1000 units tablet Take 1,000 Units by mouth as needed.   Magnesium 250 MG TABS Take 250 mg by mouth daily.   metoprolol succinate (TOPROL-XL) 50 MG 24 hr tablet Take 50 mg by mouth daily. Take with or immediately following a meal.   mometasone (NASONEX) 50 MCG/ACT nasal spray PLACE 2 SPRAYS INTO THE NOSE DAILY.   Multiple Vitamin (MULTIVITAMIN) capsule Take 1 capsule by mouth daily.   Omega-3 Fatty Acids (FISH OIL OMEGA-3 PO) Take by mouth daily.   REPATHA SURECLICK 140 MG/ML SOAJ Inject 1 mL into the skin every 14 (fourteen) days.   tiZANidine (ZANAFLEX) 4 MG tablet TAKE 1  TABLET (4 MG TOTAL) BY MOUTH 3 (THREE) TIMES DAILY.   vitamin E 200 UNIT capsule Take 200 Units by mouth daily.   famotidine (PEPCID) 40 MG tablet Take 1 tablet (40 mg total) by mouth daily as needed for heartburn or indigestion.   No facility-administered encounter medications on file as of 08/17/2022.    Allergies (verified) Aspirin, Sulfa antibiotics, Penicillins, Statins, and Latex   History: Past Medical History:  Diagnosis Date   Graves disease    Hyperlipidemia    Past Surgical History:  Procedure Laterality Date   ABDOMINAL HYSTERECTOMY     bladder tact     ROTATOR CUFF REPAIR     Family History  Problem Relation Age of Onset   Hyperlipidemia Mother    Hypertension Mother    Diabetes Mother    Heart attack Mother 32   Heart failure Mother    Cancer Father    Social History   Socioeconomic History   Marital status: Married    Spouse name: Darrell    Number of children: 3   Years of education: Not on file   Highest education level: Some college, no degree  Occupational History   Not on file  Tobacco Use   Smoking status: Never   Smokeless tobacco: Never  Vaping Use  Vaping Use: Never used  Substance and Sexual Activity   Alcohol use: No    Alcohol/week: 0.0 standard drinks of alcohol   Drug use: No   Sexual activity: Yes    Partners: Male  Other Topics Concern   Not on file  Social History Narrative   Not on file   Social Determinants of Health   Financial Resource Strain: Low Risk  (08/15/2022)   Overall Financial Resource Strain (CARDIA)    Difficulty of Paying Living Expenses: Not hard at all  Food Insecurity: No Food Insecurity (08/15/2022)   Hunger Vital Sign    Worried About Running Out of Food in the Last Year: Never true    Ran Out of Food in the Last Year: Never true  Transportation Needs: No Transportation Needs (08/15/2022)   PRAPARE - Administrator, Civil Service (Medical): No    Lack of Transportation (Non-Medical): No   Physical Activity: Insufficiently Active (08/15/2022)   Exercise Vital Sign    Days of Exercise per Week: 5 days    Minutes of Exercise per Session: 10 min  Stress: No Stress Concern Present (08/15/2022)   [employer] of Occupational Health - Occupational Stress Questionnaire    Feeling of Stress : Not at all  Social Connections: Unknown (08/15/2022)   Social Connection and Isolation Panel [NHANES]    Frequency of Communication with Friends and Family: More than three times a week    Frequency of Social Gatherings with Friends and Family: More than three times a week    Attends Religious Services: Not on Marketing executive or Organizations: Yes    Attends Banker Meetings: 1 to 4 times per year    Marital Status: Married    Tobacco Counseling Counseling given: Not Answered   Clinical Intake:  Pre-visit preparation completed: Yes  Pain : No/denies pain     BMI - recorded: 27.83 Nutritional Status: BMI 25 -29 Overweight Nutritional Risks: None Diabetes: No  How often do you need to have someone help you when you read instructions, pamphlets, or other written materials from your doctor or pharmacy?: 1 - Never  Diabetic?no  Interpreter Needed?: No  Comments: lives with husband Information entered by :: B.Mycala Warshawsky,LPN   Activities of Daily Living    08/15/2022   10:21 AM 05/10/2022    9:32 AM  In your present state of health, do you have any difficulty performing the following activities:  Hearing? 0 1  Vision? 0 0  Difficulty concentrating or making decisions? 0 0  Walking or climbing stairs? 0 0  Dressing or bathing? 0 0  Doing errands, shopping? 0 0  Preparing Food and eating ? N   Using the Toilet? N   In the past six months, have you accidently leaked urine? N   Do you have problems with loss of bowel control? N   Managing your Medications? N   Managing your Finances? N   Housekeeping or managing your Housekeeping? N      Patient Care Team: Alba Cory, MD as PCP - General (Family Medicine) Lady Gary Darlin Priestly, MD as Consulting Physician (Cardiology) Rolanda Lundborg, MD as Referring Physician Scot Jun, MD (Inactive) as Consulting Physician (Gastroenterology) Isla Pence, OD as Consulting Physician (Optometry)  Indicate any recent Medical Services you may have received from other than [facility] providers in the past year (date may be approximate).     Assessment:   This is a  routine wellness examination for Mia Camacho.  Hearing/Vision screen Hearing Screening - Comments:: Adequate hearing:little loss Vision Screening - Comments:: Adequate vision w/glasses Dr Clydene Pugh  Dietary issues and exercise activities discussed: Current Exercise Habits: Home exercise routine, Type of exercise: walking, Time (Minutes): 10, Frequency (Times/Week): 5, Weekly Exercise (Minutes/Week): 50, Intensity: Mild, Exercise limited by: orthopedic condition(s)   Goals Addressed   None    Depression Screen    08/17/2022    8:57 AM 05/10/2022    9:32 AM 11/09/2021   10:08 AM 05/11/2021   10:23 AM 03/27/2021    8:05 AM 10/27/2020   10:41 AM 04/05/2020    9:03 AM  PHQ 2/9 Scores  PHQ - 2 Score 0 0 0 0 0 0 0  PHQ- 9 Score  0 0 0 0      Fall Risk    08/15/2022   10:21 AM 05/10/2022    9:31 AM 11/09/2021   10:08 AM 05/11/2021   10:22 AM 03/27/2021    8:05 AM  Fall Risk   Falls in the past year? 0 0 0 0 0  Number falls in past yr: 0 0 0 0 0  Injury with Fall? 0 0 0 0 0  Risk for fall due to : No Fall Risks No Fall Risks No Fall Risks No Fall Risks No Fall Risks  Follow up Education provided;Falls prevention discussed Falls prevention discussed Falls prevention discussed Falls prevention discussed Falls prevention discussed    FALL RISK PREVENTION PERTAINING TO THE HOME:  Any stairs in or around the home? Yes  If so, are there any without handrails? Yes  Home free of loose throw rugs in walkways, pet beds, electrical cords,  etc? Yes  Adequate lighting in your home to reduce risk of falls? Yes   ASSISTIVE DEVICES UTILIZED TO PREVENT FALLS:  Life alert? No  Use of a cane, walker or w/c? No  Grab bars in the bathroom? No  Shower chair or bench in shower? No  Elevated toilet seat or a handicapped toilet? Yes   TIMED UP AND GO:  Was the test performed? Yes .  Length of time to ambulate 10 feet: 8 sec.   Gait steady and fast without use of assistive device  Cognitive Function:        08/17/2022    9:07 AM  6CIT Screen  What Year? 0 points  What month? 0 points  What time? 0 points  Count back from 20 0 points  Months in reverse 0 points  Repeat phrase 0 points  Total Score 0 points    Immunizations Immunization History  Administered Date(s) Administered   Fluad Quad(high Dose 65+) 04/05/2020, 11/09/2021   Influenza,inj,Quad PF,6+ Mos 04/22/2017   Influenza-Unspecified 12/03/2013, 12/21/2014, 02/02/2018   Moderna Sars-Covid-2 Vaccination 04/15/2019, 05/13/2019   Tdap 06/28/2009, 08/24/2019   Zoster Recombinat (Shingrix) 10/21/2018, 07/03/2019    TDAP status: Up to date  Flu Vaccine status: Up to date  Pneumococcal vaccine status: Up to date  Covid-19 vaccine status: Completed vaccines  Qualifies for Shingles Vaccine? Yes   Zostavax completed Yes   Shingrix Completed?: Yes  Screening Tests Health Maintenance  Topic Date Due   COVID-19 Vaccine (3 - Moderna risk series) 06/10/2019   DEXA SCAN  Never done   Pneumonia Vaccine 55+ Years old (1 of 1 - PCV) 05/10/2023 (Originally 11/22/2020)   INFLUENZA VACCINE  10/04/2022   Medicare Annual Wellness (AWV)  08/17/2023   MAMMOGRAM  08/24/2023   Colonoscopy  09/28/2025  DTaP/Tdap/Td (3 - Td or Tdap) 08/23/2029   Hepatitis C Screening  Completed   Zoster Vaccines- Shingrix  Completed   HPV VACCINES  Aged Out    Health Maintenance  Health Maintenance Due  Topic Date Due   COVID-19 Vaccine (3 - Moderna risk series) 06/10/2019    DEXA SCAN  Never done    Colorectal cancer screening: Type of screening: Colonoscopy. Completed yes. Repeat every 10 years  Mammogram status: Completed yes. Repeat every year Next scheduled 08/28/2023 with Bone Density  Lung Cancer Screening: (Low Dose CT Chest recommended if Age 64-80 years, 30 pack-year currently smoking OR have quit w/in 15years.) does not qualify.   Lung Cancer Screening Referral: no  Additional Screening:  Hepatitis C Screening: does not qualify; Completed yes  Vision Screening: Recommended annual ophthalmology exams for early detection of glaucoma and other disorders of the eye. Is the patient up to date with their annual eye exam?  Yes  Who is the provider or what is the name of the office in which the patient attends annual eye exams? Dr Clydene Pugh If pt is not established with a provider, would they like to be referred to a provider to establish care? No .   Dental Screening: Recommended annual dental exams for proper oral hygiene  Community Resource Referral / Chronic Care Management: CRR required this visit?  No   CCM required this visit?  No      Plan:     I have personally reviewed and noted the following in the patient's chart:   Medical and social history Use of alcohol, tobacco or illicit drugs  Current medications and supplements including opioid prescriptions. Patient is not currently taking opioid prescriptions. Functional ability and status Nutritional status Physical activity Advanced directives List of other physicians Hospitalizations, surgeries, and ER visits in previous 12 months Vitals Screenings to include cognitive, depression, and falls Referrals and appointments  In addition, I have reviewed and discussed with patient certain preventive protocols, quality metrics, and best practice recommendations. A written personalized care plan for preventive services as well as general preventive health recommendations were provided to  patient.     Sue Lush, LPN   05/27/4980   Nurse Notes: The patient states she is doing well and has no concerns or questions at this time.

## 2022-09-11 NOTE — Unmapped (Signed)
[facility] Refill Coordination Note    Specialty Lite Medication(s) to be Shipped:   Repatha    Other medication(s) to be shipped: No additional medications requested for fill at this time     Adrienne Roberson, DOB: 18-Mar-1955  Phone: [phone] (home)       All above HIPAA information was verified with patient.     Was a Nurse, learning disability used for this call? No    Changes to medications: Adrienne Roberson reports no changes at this time.  Changes to insurance: No      REFERRAL TO PHARMACIST     Referral to the pharmacist: Not needed      [facility]     Shipping address confirmed in Epic.     Delivery Scheduled: Yes, Expected medication delivery date: 09/19/22.     Medication will be delivered via Same Day Courier to the prescription address in Epic WAM.    Adrienne Roberson' W Danae Chen Shared [facility] Specialty Technician

## 2022-09-12 ENCOUNTER — Ambulatory Visit: Admit: 2022-09-12 | Discharge: 2022-09-13 | Payer: MEDICARE

## 2022-09-12 DIAGNOSIS — Z780 Asymptomatic menopausal state: Secondary | ICD-10-CM | POA: Diagnosis not present

## 2022-09-12 DIAGNOSIS — M8589 Other specified disorders of bone density and structure, multiple sites: Secondary | ICD-10-CM | POA: Diagnosis not present

## 2022-09-12 DIAGNOSIS — Z1231 Encounter for screening mammogram for malignant neoplasm of breast: Secondary | ICD-10-CM | POA: Diagnosis not present

## 2022-09-12 DIAGNOSIS — M8588 Other specified disorders of bone density and structure, other site: Secondary | ICD-10-CM | POA: Diagnosis not present

## 2022-09-19 DIAGNOSIS — E7849 Other hyperlipidemia: Principal | ICD-10-CM

## 2022-09-19 MED FILL — REPATHA SURECLICK 140 MG/ML SUBCUTANEOUS PEN INJECTOR: SUBCUTANEOUS | 84 days supply | Qty: 6 | Fill #1

## 2022-11-13 ENCOUNTER — Other Ambulatory Visit: Payer: Self-pay | Admitting: Nurse Practitioner

## 2022-11-13 DIAGNOSIS — J3089 Other allergic rhinitis: Secondary | ICD-10-CM

## 2022-11-14 NOTE — Telephone Encounter (Signed)
Requested Prescriptions  Pending Prescriptions Disp Refills   mometasone (NASONEX) 50 MCG/ACT nasal spray [Pharmacy Med Name: MOMETASONE FUROATE 50 MCG SPRY] 34 each 1    Sig: PLACE 2 SPRAYS INTO THE NOSE DAILY.     Ear, Nose, and Throat: Nasal Preparations - Corticosteroids Passed - 11/13/2022  7:47 AM      Passed - Valid encounter within last 12 months    Recent Outpatient Visits           6 months ago Familial hyperlipidemia, high LDL   [facility] Alba Cory, MD   9 months ago Subacute frontal sinusitis   [facility] Margarita Mail, DO   1 year ago Palpitation   [facility] Alba Cory, MD   1 year ago History of Graves' disease   [facility] Alba Cory, MD   1 year ago Diverticulitis   [facility] Mecum, Oswaldo [place], [place]       Future Appointments             In 6 months Alba Cory, MD [facility]

## 2022-12-11 NOTE — Unmapped (Signed)
[facility] Specialty and Home Delivery Pharmacy Refill Coordination Note    Specialty Lite Medication(s) to be Shipped:   Repatha    Other medication(s) to be shipped: No additional medications requested for fill at this time     Shanekia Latella, DOB: December 26, 1955  Phone: [phone] (home)       All above HIPAA information was verified with patient.     Was a Nurse, learning disability used for this call? No    Changes to medications: Spruha reports no changes at this time.  Changes to insurance: No      REFERRAL TO PHARMACIST     Referral to the pharmacist: Not needed      [facility]     Shipping address confirmed in Epic.     Delivery Scheduled: Yes, Expected medication delivery date: 10/11.     Medication will be delivered via Same Day Courier to the prescription address in Epic WAM.    Westley Gambles   [facility] Special[facility] Home Delivery Pharmacy Specialty Technician

## 2022-12-13 DIAGNOSIS — R002 Palpitations: Principal | ICD-10-CM

## 2022-12-13 MED ORDER — METOPROLOL SUCCINATE ER 50 MG TABLET,EXTENDED RELEASE 24 HR
ORAL_TABLET | 2 refills | 0 days
Start: 2022-12-13 — End: ?

## 2022-12-14 MED ORDER — METOPROLOL SUCCINATE ER 50 MG TABLET,EXTENDED RELEASE 24 HR
ORAL_TABLET | Freq: Every day | ORAL | 0 refills | 90 days | Status: CP
Start: 2022-12-14 — End: 2023-03-14

## 2022-12-14 MED FILL — REPATHA SURECLICK 140 MG/ML SUBCUTANEOUS PEN INJECTOR: SUBCUTANEOUS | 84 days supply | Qty: 6 | Fill #2

## 2022-12-27 NOTE — Unmapped (Incomplete)
Assessment/Plan   There are no diagnoses linked to this encounter.      No follow-ups on file.    Subjective:   ZOX:WRUEAV, Adrienne Spinner, MD  Chief complaint:  67 y.o. female with a past medical history of Graves disease, referred by None Per Patient Pcp , presenting for the evaluation and treatment of ***.     Per chart review, the patient the patient was followed by [facility] since 2017, she was diagnosed with PVCs and PACs. Patient was seen by me on 06/20/21 at which time, the patient noted increasing frequency of elevated HR which lasts for days. Patient was on Metoprolol with some improvement, but did not tolerate the dosage. Patient noted unable to tolerate statins. Patient was then seen by me on 08/15/21 at which time she reported lightheaded episode associated with an irregular heartbeat. Recently, patient has been doing well overall. She notes only transient and irregular episodes of Afib-like symptoms. However, she reported benefit from switching Metoprolol BID to once daily, taken at night.     At last visit: Direct LDL study ordered. Evolocumab 140 mg/mL to be taken. Metoprolol prescription altered to 25 mg on patient request.     History of Present Illness:  Today Adrienne Roberson presents to the clinic {visitstatus:113834} and ambulating {ambstatus:113835}.     Past Medical History  There is no problem list on file for this patient.      Medications:  Current Outpatient Medications   Medication Sig Dispense Refill    azelastine (ASTELIN) 137 mcg (0.1 %) nasal spray 1 spray into each nostril.      empty container (SHARPS-A-GATOR DISPOSAL SYSTEM) Misc Use as directed for sharps disposal 1 each 2    evolocumab 140 mg/mL PnIj Inject the contents of one pen (140 mg) under the skin every fourteen (14) days. 6 mL 3    metoPROLOL succinate (TOPROL-XL) 50 MG 24 hr tablet Take 1 tablet (50 mg total) by mouth daily. 90 tablet 0    omeprazole (PRILOSEC) 40 MG capsule Take 1 capsule (40 mg total) by mouth daily.       No current facility-administered medications for this visit.       Allergies  Allergies   Allergen Reactions    Aspirin Hives and Rash    Sulfa (Sulfonamide Antibiotics) Rash    Latex, Natural Rubber     Statins-Hmg-Coa Reductase Inhibitors      Muscle pain    Penicillins Nausea And Vomiting and Rash     RXN AS A CHILD       Social History:   Social History     Tobacco Use    Smoking status: Never    Smokeless tobacco: Never       Family History:  Family History   Problem Relation Age of Onset    Heart failure Mother     Heart attack Mother 98    Liver cancer Father     Breast cancer Maternal Aunt        ROS- 12 system review is negative other than what is specified in the History of Present Illness.      Objective:   Physical Exam  There were no vitals filed for this visit.  General-  Normal appearing female in no apparent distress.  Neurologic- Alert and oriented X3.  Cranial nerve II-XII grossly intact.  HEENT-  Normocephalic atraumatic head.  No scleral icterus.  Wearing face mask.  Neck- Supple, no carotid bruis, jugular venous pulsation ***  cm water.  Lungs- Clear to auscultation, no wheezes, rhonchi, or rhales.  Heart-  ***  Abdomen- Soft, nontender, no organomegally.  Extremities-  No clubbing or cyanosis.  *** pitting edema to *** bilaterally  Pulses- ***pulses in radial and dorsalis pedis bilaterally.  Psych- Normal mood, appropriate.      Laboratory data:    I have personally reviewed the images of the following diagnostic studies.      Electrocardiogram:  EKG 01/08/2023 shows sinus rhythm with low voltage and nonspecific T wave abnormality    From 06/20/21 showed: Sinus rhythm with PVC.     Cardiac monitor:     From 2017 at [facility] showed: an average rate of PVCs and PACs. No sustained dysrhythmia.     Echocardiogram:  From 07/26/2021 showed: The left ventricle is normal in size with normal wall thickness. The left ventricular systolic function is normal, LVEF is visually  estimated at 55-60%. The aortic valve is trileaflet with mildly thickened leaflets with normal excursion. The right ventricle is normal in size, with normal systolic function.    Direct LDL:  From 01/04/2022 showed:  Component      Latest Ref Rng 01/04/2022   LDL Direct      mg/dL 161.0      ***

## 2023-01-08 ENCOUNTER — Ambulatory Visit: Admit: 2023-01-08 | Discharge: 2023-01-09 | Payer: MEDICARE

## 2023-01-08 DIAGNOSIS — E7849 Other hyperlipidemia: Secondary | ICD-10-CM | POA: Diagnosis not present

## 2023-01-08 DIAGNOSIS — R002 Palpitations: Secondary | ICD-10-CM | POA: Diagnosis not present

## 2023-01-08 LAB — LDL CHOLESTEROL, DIRECT: LDL CHOLESTEROL DIRECT: 140 mg/dL

## 2023-01-08 NOTE — Unmapped (Signed)
Pt states palpitations are much better on the Metoprolol.  Sometimes she feels woozy occasionally like the way you feel right before you pass out.  It passes after a few seconds.  She feels them more at night when she lays down, but they subside on their own.  No issues with the Repatha.      Lying BP  121/58 P 77  Standing;  117/64 pulse 84 no dizziness

## 2023-03-05 NOTE — Unmapped (Signed)
[facility] Refill Coordination Note    Specialty Lite Medication(s) to be Shipped:   Repatha    Other medication(s) to be shipped: No additional medications requested for fill at this time     Adrienne Roberson, DOB: 1955/11/25  Phone: [phone] (home)       All above HIPAA information was verified with patient.     Was a Nurse, learning disability used for this call? No    Changes to medications: [name] reports no changes at this time.  Changes to insurance: No      REFERRAL TO PHARMACIST     Referral to the pharmacist: Not needed      [facility]     Shipping address confirmed in Epic.     Delivery Scheduled: Yes, Expected medication delivery date: 03/08/23.     Medication will be delivered via Same Day Courier to the prescription address in Epic WAM.    [name]   [facility] Specialty Technician

## 2023-03-09 NOTE — Unmapped (Signed)
Adrienne Roberson 's Repatha shipment will be delayed as a result of a different copay.      I have reached out to the patient  at [phone]  and communicated the delay. We will wait for a call back from the patient to reschedule the delivery.  We have not confirmed the new delivery date.

## 2023-03-11 MED FILL — REPATHA SURECLICK 140 MG/ML SUBCUTANEOUS PEN INJECTOR: SUBCUTANEOUS | 84 days supply | Qty: 6 | Fill #3

## 2023-03-19 MED ORDER — METOPROLOL SUCCINATE ER 50 MG TABLET,EXTENDED RELEASE 24 HR
ORAL_TABLET | Freq: Every day | ORAL | 3 refills | 90.00 days | Status: CP
Start: 2023-03-19 — End: 2023-06-17

## 2023-03-19 NOTE — Unmapped (Signed)
Metoprolol refilled.

## 2023-03-22 DIAGNOSIS — R002 Palpitations: Principal | ICD-10-CM

## 2023-03-22 MED ORDER — METOPROLOL SUCCINATE ER 50 MG TABLET,EXTENDED RELEASE 24 HR
ORAL_TABLET | Freq: Every day | ORAL | 3 refills | 90.00 days | Status: CP
Start: 2023-03-22 — End: 2023-03-22

## 2023-03-26 ENCOUNTER — Ambulatory Visit (INDEPENDENT_AMBULATORY_CARE_PROVIDER_SITE_OTHER): Payer: 59 | Admitting: Family Medicine

## 2023-03-26 ENCOUNTER — Encounter: Payer: Self-pay | Admitting: Family Medicine

## 2023-03-26 ENCOUNTER — Ambulatory Visit: Payer: Self-pay

## 2023-03-26 VITALS — BP 108/70 | HR 87 | Temp 98.1°F | Resp 16 | Ht 59.0 in | Wt 139.8 lb

## 2023-03-26 DIAGNOSIS — K219 Gastro-esophageal reflux disease without esophagitis: Secondary | ICD-10-CM | POA: Diagnosis not present

## 2023-03-26 DIAGNOSIS — R300 Dysuria: Secondary | ICD-10-CM | POA: Diagnosis not present

## 2023-03-26 LAB — POCT URINALYSIS DIPSTICK
Bilirubin, UA: NEGATIVE
Glucose, UA: NEGATIVE
Ketones, UA: NEGATIVE
Nitrite, UA: NEGATIVE
Protein, UA: NEGATIVE
Spec Grav, UA: <=1.005 — AB (ref 1.010–1.025)
Urobilinogen, UA: 0.2 U/dL
pH, UA: 5 (ref 5.0–8.0)

## 2023-03-26 MED ORDER — OMEPRAZOLE 40 MG PO CPDR: 40.0000 mg | DELAYED_RELEASE_CAPSULE | Freq: Every day | ORAL | 1 refills | Status: DC

## 2023-03-26 MED ORDER — CIPROFLOXACIN HCL 250 MG PO TABS: 250.0000 mg | ORAL_TABLET | Freq: Two times a day (BID) | ORAL | 0 refills | Status: AC

## 2023-03-26 MED ORDER — PHENAZOPYRIDINE HCL 100 MG PO TABS: 100.0000 mg | ORAL_TABLET | Freq: Three times a day (TID) | ORAL | 0 refills | Status: DC | PRN

## 2023-03-26 NOTE — Progress Notes (Signed)
Name: Mia Camacho   MRN: [id number]    DOB: March 13, 1955   Date:03/26/2023       Progress Note  Subjective  Chief Complaint  Chief Complaint  Patient presents with   Dysuria    Sx started today early morning   Hematuria    Discussed the use of AI scribe software for clinical note transcription with the patient, who gave verbal consent to proceed.  History of Present Illness   The patient, with a history of kidney stones and bladder infections, presented with acute onset of urinary symptoms. She reported experiencing pain and burning during urination, which she described as being located 'real low.' The pain was not in the lower belly or back, differentiating it from her typical kidney stone pain. She also noted a single episode of hematuria, which subsequently resolved to a pinkish color and then cleared completely.  The patient denied any recent sexual activity or physical activities that could have potentially caused the symptoms. She also denied any systemic symptoms such as fever, chills, or nausea. The patient has known allergies to penicillin and sulfa drugs.  In addition to the urinary symptoms, the patient mentioned occasional use of omeprazole for gastroesophageal reflux disease (GERD), particularly when consuming spicy foods. She reported a significant improvement in her GERD symptoms after discontinuing coffee consumption approximately nine months to a year ago.  The patient's narrative suggests an acute urinary tract infection, although she has a history of kidney stones. The patient's symptoms, particularly the urinary pain and single episode of hematuria, along with the absence of systemic symptoms, support this interpretation. The patient's allergies to penicillin and sulfa drugs are important considerations for antibiotic therapy. The patient's GERD and its management with occasional omeprazole use is a separate but relevant aspect of her medical history.        Patient  Active Problem List   Diagnosis Date Noted   Anxiety 09/15/2019   Myalgia due to statin 05/01/2018   GERD without esophagitis 05/01/2018   Palpitations 12/07/2015   Perennial allergic rhinitis 09/13/2014   Back pain, chronic 09/13/2014   Diverticulosis 09/13/2014   Bursitis, trochanteric 09/13/2014   Herpes 09/13/2014   Hiatal hernia 09/13/2014   H/O: hysterectomy 09/13/2014   Personal history of urinary calculi 09/13/2014   Menopause 09/13/2014   Hernia, rectovaginal 09/13/2014   Primary osteoarthritis of both knees 09/13/2014   Pelvic muscle wasting 09/13/2014   Cleft palate 09/13/2014   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/13/2014   Epicondylitis elbow, medial 03/18/2014   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   Gastroesophageal reflux disease with hiatal hernia 06/28/2009   Vitamin D deficiency 04/05/2009   Familial hyperlipidemia, high LDL 02/10/2009   History of Graves' disease 02/10/2009    Social History   Tobacco Use   Smoking status: Never   Smokeless tobacco: Never  Substance Use Topics   Alcohol use: No    Alcohol/week: 0.0 standard drinks of alcohol     Current Outpatient Medications:    cholecalciferol (VITAMIN D) 1000 units tablet, Take 1,000 Units by mouth as needed., Disp: , Rfl:    Magnesium 250 MG TABS, Take 250 mg by mouth daily., Disp: , Rfl:    metoprolol succinate (TOPROL-XL) 50 MG 24 hr tablet, Take 50 mg by mouth daily. Take with or immediately following a meal., Disp: , Rfl:    mometasone (NASONEX) 50 MCG/ACT nasal spray, PLACE 2 SPRAYS INTO THE NOSE DAILY., Disp: 34 each, Rfl: 1   Multiple Vitamin (MULTIVITAMIN) capsule, Take 1  capsule by mouth daily., Disp: , Rfl:    Omega-3 Fatty Acids (FISH OIL OMEGA-3 PO), Take by mouth daily., Disp: , Rfl:    REPATHA SURECLICK 140 MG/ML SOAJ, Inject 1 mL into the skin every 14 (fourteen) days., Disp: , Rfl:    tiZANidine (ZANAFLEX) 4 MG tablet, TAKE 1 TABLET (4 MG TOTAL) BY MOUTH 3 (THREE) TIMES DAILY., Disp: 90  tablet, Rfl: 0   vitamin E 200 UNIT capsule, Take 200 Units by mouth daily., Disp: , Rfl:    famotidine (PEPCID) 40 MG tablet, Take 1 tablet (40 mg total) by mouth daily as needed for heartburn or indigestion., Disp: 90 tablet, Rfl: 0  Allergies  Allergen Reactions   Aspirin Rash   Sulfa Antibiotics Rash   Penicillins Nausea And Vomiting   Statins     Muscle pain   Latex Rash    Other Reaction: WELTS    ROS  Ten systems reviewed and is negative except as mentioned in HPI    Objective  Vitals:   03/26/23 0945  BP: 108/70  Pulse: 87  Resp: 16  Temp: 98.1 F (36.7 C)  TempSrc: Oral  SpO2: 98%  Weight: 139 lb 12.8 oz (63.4 kg)  Height: 4\' 11"  (1.499 m)    Body mass index is 28.24 kg/m.    Physical Exam  Constitutional: Patient appears well-developed and well-nourished.  No distress.  HEENT: head atraumatic, normocephalic, pupils equal and reactive to light, , neck supple Cardiovascular: Normal rate, regular rhythm and normal heart sounds.  No murmur heard. No BLE edema. Pulmonary/Chest: Effort normal and breath sounds normal. No respiratory distress. Abdominal: Soft.  There is no tenderness. CVA tenderness negative Psychiatric: Patient has a normal mood and affect. behavior is normal. Judgment and thought content normal.   Recent Results (from the past 2160 hours)  POCT urinalysis dipstick     Status: Abnormal   Collection Time: 03/26/23  9:49 AM  Result Value Ref Range   Color, UA Straw    Clarity, UA Clear    Glucose, UA Negative Negative   Bilirubin, UA Negative    Ketones, UA Negative    Spec Grav, UA <=1.005 (A) 1.010 - 1.025   Blood, UA LArge    pH, UA 5.0 5.0 - 8.0   Protein, UA Negative Negative   Urobilinogen, UA 0.2 0.2 or 1.0 E.U./dL   Nitrite, UA Negative    Leukocytes, UA Large (3+) (A) Negative   Appearance clear    Odor foul      Assessment and Plan    Urinary Tract Infection Acute onset of dysuria, hematuria, and suprapubic pain. No  fever, chills, or flank pain. Urinalysis shows blood and pus, but no nitrates. History of kidney stones, but current symptoms are more consistent with UTI. -Start Ciprofloxacin for 3 days. -Send urine for culture to confirm diagnosis and guide antibiotic therapy. -Start Pyridium (AZO) for symptomatic relief of dysuria. -Advise to increase fluid intake and consider cranberry juice.  Gastroesophageal Reflux Disease Reports occasional use of Omeprazole 40mg  for spicy food consumption. -Renew Omeprazole 40mg  prescription, 30 tablets.

## 2023-03-26 NOTE — Telephone Encounter (Signed)
     Chief Complaint: Painful urination, blood Symptoms: Above Frequency: Today Pertinent Negatives: Patient denies fever Disposition: [] ED /[] Urgent Care (no appt availability in office) / [x] Appointment(In office/virtual)/ []  [place] [facility]/ [] Home Care/ [] Refused Recommended Disposition /[] [employer] [facility]/ []  Follow-up with PCP Additional Notes: Agrees with appointment.  Reason for Disposition  Urinating more frequently than usual (i.e., frequency)  Answer Assessment - Initial Assessment Questions 1. SYMPTOM: "What's the main symptom you're concerned about?" (e.g., frequency, incontinence)     Pain, blood 2. ONSET: "When did the    start?"     Today 3. PAIN: "Is there any pain?" If Yes, ask: "How bad is it?" (Scale: 1-10; mild, moderate, severe)     3-4 4. CAUSE: "What do you think is causing the symptoms?"     UTI 5. OTHER SYMPTOMS: "Do you have any other symptoms?" (e.g., blood in urine, fever, flank pain, pain with urination)     No 6. PREGNANCY: "Is there any chance you are pregnant?" "When was your last menstrual period?"     No  Protocols used: Urinary Symptoms-A-AH

## 2023-03-27 LAB — URINE CULTURE
MICRO NUMBER:: 15982092
Result:: NO GROWTH
SPECIMEN QUALITY:: ADEQUATE

## 2023-03-28 ENCOUNTER — Encounter: Payer: Self-pay | Admitting: Family Medicine

## 2023-04-02 ENCOUNTER — Ambulatory Visit (INDEPENDENT_AMBULATORY_CARE_PROVIDER_SITE_OTHER): Payer: 59 | Admitting: Family Medicine

## 2023-04-02 ENCOUNTER — Encounter: Payer: Self-pay | Admitting: Family Medicine

## 2023-04-02 VITALS — BP 110/70 | HR 95 | Resp 16 | Ht 59.0 in | Wt 138.3 lb

## 2023-04-02 DIAGNOSIS — R3129 Other microscopic hematuria: Secondary | ICD-10-CM | POA: Diagnosis not present

## 2023-04-02 DIAGNOSIS — R300 Dysuria: Secondary | ICD-10-CM

## 2023-04-02 DIAGNOSIS — N816 Rectocele: Secondary | ICD-10-CM | POA: Diagnosis not present

## 2023-04-02 DIAGNOSIS — Z87442 Personal history of urinary calculi: Secondary | ICD-10-CM | POA: Diagnosis not present

## 2023-04-02 NOTE — Progress Notes (Signed)
Name: Mia Camacho   MRN: [id number]    DOB: Aug 21, 1955   Date:04/02/2023       Progress Note  Subjective  Chief Complaint  Chief Complaint  Patient presents with   Gynecologic Exam    Pelvic exam- pt states no more sx and no signs of bleeding    HPI   She came in 01/21 due to hematuria and dysuria , urine culture was negative, she has a history of kidney stones, she had one more episode of hematuria after her visit . We sent her home with Azo and pain resolved after taking otc medication. She finished antibiotics. No pain at this time   Patient Active Problem List   Diagnosis Date Noted   Anxiety 09/15/2019   Myalgia due to statin 05/01/2018   GERD without esophagitis 05/01/2018   Palpitations 12/07/2015   Perennial allergic rhinitis 09/13/2014   Back pain, chronic 09/13/2014   Diverticulosis 09/13/2014   Bursitis, trochanteric 09/13/2014   Herpes 09/13/2014   Hiatal hernia 09/13/2014   H/O: hysterectomy 09/13/2014   Personal history of urinary calculi 09/13/2014   Menopause 09/13/2014   Hernia, rectovaginal 09/13/2014   Primary osteoarthritis of both knees 09/13/2014   Pelvic muscle wasting 09/13/2014   Cleft palate 09/13/2014   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/13/2014   Epicondylitis elbow, medial 03/18/2014   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   Gastroesophageal reflux disease with hiatal hernia 06/28/2009   Vitamin D deficiency 04/05/2009   Familial hyperlipidemia, high LDL 02/10/2009   History of Graves' disease 02/10/2009    Social History   Tobacco Use   Smoking status: Never   Smokeless tobacco: Never  Substance Use Topics   Alcohol use: No    Alcohol/week: 0.0 standard drinks of alcohol     Current Outpatient Medications:    cholecalciferol (VITAMIN D) 1000 units tablet, Take 1,000 Units by mouth as needed., Disp: , Rfl:    Magnesium 250 MG TABS, Take 250 mg by mouth daily., Disp: , Rfl:    metoprolol succinate (TOPROL-XL) 50 MG 24 hr tablet, Take 50 mg  by mouth daily. Take with or immediately following a meal., Disp: , Rfl:    mometasone (NASONEX) 50 MCG/ACT nasal spray, PLACE 2 SPRAYS INTO THE NOSE DAILY., Disp: 34 each, Rfl: 1   Multiple Vitamin (MULTIVITAMIN) capsule, Take 1 capsule by mouth daily., Disp: , Rfl:    Omega-3 Fatty Acids (FISH OIL OMEGA-3 PO), Take by mouth daily., Disp: , Rfl:    omeprazole (PRILOSEC) 40 MG capsule, Take 1 capsule (40 mg total) by mouth daily., Disp: 30 capsule, Rfl: 1   phenazopyridine (PYRIDIUM) 100 MG tablet, Take 1 tablet (100 mg total) by mouth 3 (three) times daily as needed for pain., Disp: 10 tablet, Rfl: 0   REPATHA SURECLICK 140 MG/ML SOAJ, Inject 1 mL into the skin every 14 (fourteen) days., Disp: , Rfl:    tiZANidine (ZANAFLEX) 4 MG tablet, TAKE 1 TABLET (4 MG TOTAL) BY MOUTH 3 (THREE) TIMES DAILY., Disp: 90 tablet, Rfl: 0   vitamin E 200 UNIT capsule, Take 200 Units by mouth daily., Disp: , Rfl:   Allergies  Allergen Reactions   Aspirin Rash   Sulfa Antibiotics Rash   Penicillins Nausea And Vomiting   Statins     Muscle pain   Latex Rash    Other Reaction: WELTS    ROS  Ten systems reviewed and is negative except as mentioned in HPI    Objective  Vitals:   04/02/23  1002  BP: 110/70  Pulse: 95  Resp: 16  SpO2: 98%  Weight: 138 lb 4.8 oz (62.7 kg)  Height: 4\' 11"  (1.499 m)    Body mass index is 27.93 kg/m.    Physical Exam  Constitutional: Patient appears well-developed and well-nourished. Obese  No distress.  HEENT: head atraumatic, normocephalic, pupils equal and reactive to light, , neck supple, throat within normal limits Cardiovascular: Normal rate, regular rhythm and normal heart sounds.  No murmur heard. No BLE edema. Pulmonary/Chest: Effort normal and breath sounds normal. No respiratory distress. Pelvic: rectocele, s/p hysterectomy, normal urethra  Abdominal: Soft.  There is no tenderness. Psychiatric: Patient has a normal mood and affect. behavior is normal.  Judgment and thought content normal.   Recent Results (from the past 2160 hours)  POCT urinalysis dipstick     Status: Abnormal   Collection Time: 03/26/23  9:49 AM  Result Value Ref Range   Color, UA Straw    Clarity, UA Clear    Glucose, UA Negative Negative   Bilirubin, UA Negative    Ketones, UA Negative    Spec Grav, UA <=1.005 (A) 1.010 - 1.025   Blood, UA LArge    pH, UA 5.0 5.0 - 8.0   Protein, UA Negative Negative   Urobilinogen, UA 0.2 0.2 or 1.0 E.U./dL   Nitrite, UA Negative    Leukocytes, UA Large (3+) (A) Negative   Appearance clear    Odor foul   Urine Culture     Status: None   Collection Time: 03/26/23 10:41 AM   Specimen: Urine  Result Value Ref Range   MICRO NUMBER: [id number]    SPECIMEN QUALITY: Adequate    Sample Source URINE    STATUS: FINAL    Result: No Growth      Assessment & Plan  1. Hematuria, microscopic (Primary)  - Urinalysis, Complete Recheck ua, if positive on urinalysis done at the lab we will refer her to urologist  2. History of kidney stones  - Urinalysis, Complete  3. Dysuria  Resolved after one dose of Azo, finished antibiotics but negative urine culture  4. Rectocele  Discussed referral to uro-gynecologist when she is ready

## 2023-04-03 ENCOUNTER — Encounter: Payer: Self-pay | Admitting: Family Medicine

## 2023-04-03 LAB — URINALYSIS, COMPLETE
Bacteria, UA: NONE SEEN /[HPF]
Bilirubin Urine: NEGATIVE
Glucose, UA: NEGATIVE
Hgb urine dipstick: NEGATIVE
Hyaline Cast: NONE SEEN /[LPF]
Ketones, ur: NEGATIVE
Leukocytes,Ua: NEGATIVE
Nitrite: NEGATIVE
Protein, ur: NEGATIVE
RBC / HPF: NONE SEEN /[HPF] (ref 0–2)
Specific Gravity, Urine: 1.021 (ref 1.001–1.035)
Squamous Epithelial / HPF: NONE SEEN /[HPF] (ref <=5)
WBC, UA: NONE SEEN /[HPF] (ref 0–5)
pH: <=5 (ref 5.0–8.0)

## 2023-04-12 ENCOUNTER — Encounter: Payer: Self-pay | Admitting: Family Medicine

## 2023-04-12 ENCOUNTER — Ambulatory Visit: Payer: PPO | Admitting: Family Medicine

## 2023-04-12 VITALS — BP 116/72 | HR 100 | Temp 98.1°F | Resp 16 | Ht 59.0 in | Wt 136.1 lb

## 2023-04-12 DIAGNOSIS — J0110 Acute frontal sinusitis, unspecified: Secondary | ICD-10-CM | POA: Diagnosis not present

## 2023-04-12 MED ORDER — AZITHROMYCIN 500 MG PO TABS: 500.0000 mg | ORAL_TABLET | Freq: Every day | ORAL | 0 refills | Status: AC

## 2023-04-12 NOTE — Progress Notes (Signed)
 Name: Mia Camacho   MRN: [id number]    DOB: 26-Oct-1955   Date:04/12/2023       Progress Note  Subjective  Chief Complaint  Chief Complaint  Patient presents with   Nasal Congestion   Facial Pain    Onset for 10 days     Discussed the use of AI scribe software for clinical note transcription with the patient, who gave verbal consent to proceed.  History of Present Illness   Mia Camacho is a 68 year old female with frontal sinusitis who presents with sinus congestion and facial pain.  She has been experiencing sinus congestion and facial pain for the past 9 to 10 days. The symptoms began with sneezing and progressed to thick, green nasal discharge. There is soreness in the frontal area, particularly above the eyebrows, and leaning over exacerbates the pain.  She has a history of frontal sinusitis, with the last episode requiring antibiotics in December 2023. She has been using a sinus rinse, which she finds helpful, and reports feeling better since yesterday. However, she continues to experience pressure and drainage. She has not been using her prescribed Nasonex  nasal spray recently.  She performed a COVID test, which was negative, although her husband, daughter, and sister had similar symptoms. She mentions that her family members were sick for a longer duration.  No post-nasal drip, cough, wheezing, or chest symptoms, with the issue localized to the frontal area.        Patient Active Problem List   Diagnosis Date Noted   Anxiety 09/15/2019   Myalgia due to statin 05/01/2018   GERD without esophagitis 05/01/2018   Palpitations 12/07/2015   Perennial allergic rhinitis 09/13/2014   Back pain, chronic 09/13/2014   Diverticulosis 09/13/2014   Bursitis, trochanteric 09/13/2014   Herpes 09/13/2014   Hiatal hernia 09/13/2014   H/O: hysterectomy 09/13/2014   Personal history of urinary calculi 09/13/2014   Menopause 09/13/2014   Hernia, rectovaginal 09/13/2014   Primary  osteoarthritis of both knees 09/13/2014   Pelvic muscle wasting 09/13/2014   Cleft palate 09/13/2014   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/13/2014   Epicondylitis elbow, medial 03/18/2014   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   Gastroesophageal reflux disease with hiatal hernia 06/28/2009   Vitamin D  deficiency 04/05/2009   Familial hyperlipidemia, high LDL 02/10/2009   History of Graves' disease 02/10/2009    Social History   Tobacco Use   Smoking status: Never   Smokeless tobacco: Never  Substance Use Topics   Alcohol use: No    Alcohol/week: 0.0 standard drinks of alcohol     Current Outpatient Medications:    cholecalciferol (VITAMIN D ) 1000 units tablet, Take 1,000 Units by mouth as needed., Disp: , Rfl:    Magnesium 250 MG TABS, Take 250 mg by mouth daily., Disp: , Rfl:    metoprolol  succinate (TOPROL -XL) 50 MG 24 hr tablet, Take 50 mg by mouth daily. Take with or immediately following a meal., Disp: , Rfl:    mometasone  (NASONEX ) 50 MCG/ACT nasal spray, PLACE 2 SPRAYS INTO THE NOSE DAILY., Disp: 34 each, Rfl: 1   Multiple Vitamin (MULTIVITAMIN) capsule, Take 1 capsule by mouth daily., Disp: , Rfl:    Omega-3 Fatty Acids (FISH OIL OMEGA-3 PO), Take by mouth daily., Disp: , Rfl:    omeprazole  (PRILOSEC) 40 MG capsule, Take 1 capsule (40 mg total) by mouth daily., Disp: 30 capsule, Rfl: 1   phenazopyridine  (PYRIDIUM ) 100 MG tablet, Take 1 tablet (100 mg total) by mouth  3 (three) times daily as needed for pain., Disp: 10 tablet, Rfl: 0   REPATHA  SURECLICK 140 MG/ML SOAJ, Inject 1 mL into the skin every 14 (fourteen) days., Disp: , Rfl:    tiZANidine  (ZANAFLEX ) 4 MG tablet, TAKE 1 TABLET (4 MG TOTAL) BY MOUTH 3 (THREE) TIMES DAILY., Disp: 90 tablet, Rfl: 0   vitamin E 200 UNIT capsule, Take 200 Units by mouth daily., Disp: , Rfl:   Allergies  Allergen Reactions   Aspirin Rash   Sulfa Antibiotics Rash   Penicillins Nausea And Vomiting   Statins     Muscle pain   Latex Rash    Other  Reaction: WELTS    ROS  Ten systems reviewed and is negative except as mentioned in HPI    Objective  Vitals:   04/12/23 0952  BP: 116/72  Pulse: 100  Resp: 16  Temp: 98.1 F (36.7 C)  TempSrc: Oral  SpO2: 97%  Weight: 136 lb 1.6 oz (61.7 kg)  Height: 4' 11 (1.499 m)    Body mass index is 27.49 kg/m.    Physical Exam  Constitutional: Patient appears well-developed and well-nourished. No distress.  HEENT: head atraumatic, normocephalic, pupils equal and reactive to light, , neck supple, boggy turbinates, clear rhinorrhea, tenderness during percussion of frontal sinus Cardiovascular: Normal rate, regular rhythm and normal heart sounds.  No murmur heard. No BLE edema. Pulmonary/Chest: Effort normal and breath sounds normal. No respiratory distress. Abdominal: Soft.  There is no tenderness. Psychiatric: Patient has a normal mood and affect. behavior is normal. Judgment and thought content normal.   Recent Results (from the past 2160 hours)  POCT urinalysis dipstick     Status: Abnormal   Collection Time: 03/26/23  9:49 AM  Result Value Ref Range   Color, UA Straw    Clarity, UA Clear    Glucose, UA Negative Negative   Bilirubin, UA Negative    Ketones, UA Negative    Spec Grav, UA <=1.005 (A) 1.010 - 1.025   Blood, UA LArge    pH, UA 5.0 5.0 - 8.0   Protein, UA Negative Negative   Urobilinogen, UA 0.2 0.2 or 1.0 E.U./dL   Nitrite, UA Negative    Leukocytes, UA Large (3+) (A) Negative   Appearance clear    Odor foul   Urine Culture     Status: None   Collection Time: 03/26/23 10:41 AM   Specimen: Urine  Result Value Ref Range   MICRO NUMBER: [id number]    SPECIMEN QUALITY: Adequate    Sample Source URINE    STATUS: FINAL    Result: No Growth   Urinalysis, Complete     Status: None   Collection Time: 04/02/23 10:39 AM  Result Value Ref Range   Color, Urine YELLOW YELLOW   APPearance CLEAR CLEAR   Specific Gravity, Urine 1.021 1.001 - 1.035   pH < OR = 5.0  5.0 - 8.0   Glucose, UA NEGATIVE NEGATIVE   Bilirubin Urine NEGATIVE NEGATIVE   Ketones, ur NEGATIVE NEGATIVE   Hgb urine dipstick NEGATIVE NEGATIVE   Protein, ur NEGATIVE NEGATIVE   Nitrite NEGATIVE NEGATIVE   Leukocytes,Ua NEGATIVE NEGATIVE   WBC, UA NONE SEEN 0 - 5 /HPF   RBC / HPF NONE SEEN 0 - 2 /HPF   Squamous Epithelial / HPF NONE SEEN < OR = 5 /HPF   Bacteria, UA NONE SEEN NONE SEEN /HPF   Hyaline Cast NONE SEEN NONE SEEN /LPF   Note  Comment: This urine was analyzed for the presence of WBC,  RBC, bacteria, casts, and other formed elements.  Only those elements seen were reported. . .      Assessment and Plan    Frontal Sinusitis Day 9-10 of symptoms with green nasal discharge, frontal sinus pressure, and pain. Improvement noted with sinus rinses. No signs of lower respiratory involvement. History of sinusitis with last antibiotic treatment in December 2023. -Continue sinus rinses. -Resume use of Nasonex  to decrease nasal swelling. -Azithromycin  500mg  for 3 days sent to pharmacy. Patient advised to pick up and start only if symptoms worsen by Monday.  No other active medical issues discussed.

## 2023-04-17 ENCOUNTER — Other Ambulatory Visit: Payer: Self-pay | Admitting: Family Medicine

## 2023-04-17 DIAGNOSIS — K219 Gastro-esophageal reflux disease without esophagitis: Secondary | ICD-10-CM

## 2023-04-19 DIAGNOSIS — R002 Palpitations: Principal | ICD-10-CM

## 2023-04-19 MED ORDER — METOPROLOL SUCCINATE ER 50 MG TABLET,EXTENDED RELEASE 24 HR
ORAL_TABLET | Freq: Every day | ORAL | 3 refills | 90.00 days | Status: CP
Start: 2023-04-19 — End: 2023-07-18

## 2023-04-20 ENCOUNTER — Other Ambulatory Visit: Payer: Self-pay | Admitting: Family Medicine

## 2023-04-20 DIAGNOSIS — J3089 Other allergic rhinitis: Secondary | ICD-10-CM

## 2023-05-13 ENCOUNTER — Ambulatory Visit: Payer: Self-pay | Admitting: Family Medicine

## 2023-05-13 NOTE — Progress Notes (Unsigned)
   There were no vitals taken for this visit.   Subjective:    Patient ID: Mia Camacho, female    DOB: 04/26/1955, 68 y.o.   MRN: [id number]  HPI: Mia Camacho is a 68 y.o. female  No chief complaint on file.   Discussed the use of AI scribe software for clinical note transcription with the patient, who gave verbal consent to proceed.  History of Present Illness           04/12/2023    9:52 AM 03/26/2023    9:45 AM 08/17/2022    8:57 AM  Depression screen PHQ 2/9  Decreased Interest 0 0 0  Down, Depressed, Hopeless 0 0 0  PHQ - 2 Score 0 0 0  Altered sleeping 0 0   Tired, decreased energy 0 0   Change in appetite 0 0   Feeling bad or failure about yourself  0 0   Trouble concentrating 0 0   Moving slowly or fidgety/restless 0 0   Suicidal thoughts 0 0   PHQ-9 Score 0 0   Difficult doing work/chores Not difficult at all Not difficult at all     Relevant past medical, surgical, family and social history reviewed and updated as indicated. Interim medical history since our last visit reviewed. Allergies and medications reviewed and updated.  Review of Systems  Per HPI unless specifically indicated above     Objective:    There were no vitals taken for this visit.  {Vitals History (Optional):23777} Wt Readings from Last 3 Encounters:  04/12/23 136 lb 1.6 oz (61.7 kg)  04/02/23 138 lb 4.8 oz (62.7 kg)  03/26/23 139 lb 12.8 oz (63.4 kg)    Physical Exam  Results for orders placed or performed in visit on 04/02/23  Urinalysis, Complete   Collection Time: 04/02/23 10:39 AM  Result Value Ref Range   Color, Urine YELLOW YELLOW   APPearance CLEAR CLEAR   Specific Gravity, Urine 1.021 1.001 - 1.035   pH < OR = 5.0 5.0 - 8.0   Glucose, UA NEGATIVE NEGATIVE   Bilirubin Urine NEGATIVE NEGATIVE   Ketones, ur NEGATIVE NEGATIVE   Hgb urine dipstick NEGATIVE NEGATIVE   Protein, ur NEGATIVE NEGATIVE   Nitrite NEGATIVE NEGATIVE   Leukocytes,Ua NEGATIVE NEGATIVE   WBC, UA  NONE SEEN 0 - 5 /HPF   RBC / HPF NONE SEEN 0 - 2 /HPF   Squamous Epithelial / HPF NONE SEEN < OR = 5 /HPF   Bacteria, UA NONE SEEN NONE SEEN /HPF   Hyaline Cast NONE SEEN NONE SEEN /LPF   Note     {Labs (Optional):23779}    Assessment & Plan:   Problem List Items Addressed This Visit   None    Assessment and Plan             Follow up plan: No follow-ups on file.

## 2023-05-14 ENCOUNTER — Ambulatory Visit (INDEPENDENT_AMBULATORY_CARE_PROVIDER_SITE_OTHER): Admitting: Nurse Practitioner

## 2023-05-14 ENCOUNTER — Encounter: Payer: Self-pay | Admitting: Nurse Practitioner

## 2023-05-14 VITALS — BP 112/66 | HR 104 | Resp 18 | Ht 59.0 in | Wt 134.9 lb

## 2023-05-14 DIAGNOSIS — J0110 Acute frontal sinusitis, unspecified: Secondary | ICD-10-CM | POA: Diagnosis not present

## 2023-05-14 DIAGNOSIS — R051 Acute cough: Secondary | ICD-10-CM | POA: Diagnosis not present

## 2023-05-14 MED ORDER — PROMETHAZINE-DM 6.25-15 MG/5ML PO SYRP: 5.0000 mL | ORAL_SOLUTION | Freq: Four times a day (QID) | ORAL | 0 refills | Status: AC | PRN

## 2023-05-14 MED ORDER — BENZONATATE 100 MG PO CAPS: 200.0000 mg | ORAL_CAPSULE | Freq: Two times a day (BID) | ORAL | 0 refills | Status: AC | PRN

## 2023-05-14 MED ORDER — DOXYCYCLINE HYCLATE 100 MG PO TABS
100.0000 mg | ORAL_TABLET | Freq: Two times a day (BID) | ORAL | 0 refills | Status: AC
Start: 2023-05-14 — End: 2023-05-21

## 2023-05-21 DIAGNOSIS — E7849 Other hyperlipidemia: Principal | ICD-10-CM

## 2023-05-21 MED ORDER — REPATHA SURECLICK 140 MG/ML SUBCUTANEOUS PEN INJECTOR
SUBCUTANEOUS | 2 refills | 84.00 days | Status: CP
Start: 2023-05-21 — End: ?
  Filled 2023-05-29: qty 6, 84d supply, fill #0

## 2023-05-21 NOTE — Unmapped (Signed)
 This pharmacist was notified by a technician that this patient has stopped their metoprolol due to running out while on vacation.. I have reviewed the patient's medical record and have determined that no further pharmacist action is needed. Patient will reach out to her provider.      Approximate time spent: 0-5 minutes    Clydell Hakim, PharmD, Clinical Specialty Pharmacist  [facility]

## 2023-05-21 NOTE — Unmapped (Signed)
 [facility] Refill Coordination Note    Specialty Lite Medication(s) to be Shipped:   Repatha    Other medication(s) to be shipped: No additional medications requested for fill at this time     Adrienne Roberson, DOB: 10/19/1955  Phone: [phone] (home)       All above HIPAA information was verified with patient.     Was a Nurse, learning disability used for this call? No    Changes to medications:  stopping metoprolol as ran out while out of town. She has not started back on it yet, but will reach out to provider in case needing titration or anything else.  Changes to insurance: No      REFERRAL TO PHARMACIST     Referral to the pharmacist:  stopped metoprolol on her own and has not started back on it  yet      SHIPPING     Shipping address confirmed in Epic.     Cost and Payment: Patient has a copay of $45. They are aware and have authorized the pharmacy to charge the credit card on file.    Delivery Scheduled: Yes, Expected medication delivery date: 3/26.  However, Rx request for refills was sent to the provider as there are none remaining.     Medication will be delivered via Same Day Courier to the prescription address in Epic WAM.    Westley Gambles   [facility] Specialty Technician

## 2023-07-18 ENCOUNTER — Ambulatory Visit
Admit: 2023-07-18 | Discharge: 2023-07-19 | Payer: Medicare (Managed Care) | Attending: Adult Health | Primary: Adult Health

## 2023-07-18 DIAGNOSIS — R002 Palpitations: Secondary | ICD-10-CM | POA: Diagnosis not present

## 2023-07-18 DIAGNOSIS — R011 Cardiac murmur, unspecified: Secondary | ICD-10-CM | POA: Diagnosis not present

## 2023-07-18 DIAGNOSIS — E7849 Other hyperlipidemia: Secondary | ICD-10-CM | POA: Diagnosis not present

## 2023-07-18 MED ORDER — EZETIMIBE 10 MG TABLET
ORAL_TABLET | Freq: Every day | ORAL | 3 refills | 90.00000 days | Status: CP
Start: 2023-07-18 — End: 2024-07-17

## 2023-07-18 NOTE — Unmapped (Signed)
 Pt  in for follow up palpitations..  Has not  had palpitations. Has been trying to  avoid caffeine and  sugar which helps

## 2023-07-18 NOTE — Unmapped (Signed)
 Assessment/Plan   1. Palpitations  Skipped beats largely well controlled with toprol 50 mg daily.     2. Familial hyperlipidemia, high LDL  Hx of statin intolerance.  Prior LDL up to 287.   Currently on repatha but LDL not at goal.   Shared decision to start zetia 10 mg to further lower LDL and CV risk  - LDL Cholesterol, Direct - 4-6 wks    Lab Results   Component Value Date    LDL 140.0 01/08/2023     3. Systolic murmur  Documented on prior clinic encounters. 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     Return in about 6 months (around 01/18/2024). W/ Dr. Linnie Riches    Subjective:   ZOX:WRUEAV, Adrienne Dally, MD  Chief complaint:  68 y.o. female with a past medical history of Graves disease, FH, palpitations who presents for follow-up    History of Present Illness:    Per chart review, the patient the patient was followed by [facility] since 2017, she was diagnosed with PVCs and PACs. This has been controlled with metoprolol. She is on repatha for lipid control.      Today Adrienne Roberson presents to the clinic accompanied by husband reporting that she feels she is doing well.  She was last seen 01/2023 by Dr. Linnie Riches. Was recommended to start zetia or bempedoic acid but was not started.   She reports palpitations are largely well controlled. Triggered by anxiety.   Going to gym 3x/wk - walks on treadmill for 30 min, stationary bike 20 min and some strength training. No chest pain or dyspnea.     Mom fatal MI 46.     Past Medical History  Patient Active Problem List   Diagnosis    Familial hyperlipidemia, high LDL    Gastroesophageal reflux disease with hiatal hernia    History of Graves' disease    Palpitations    Vitamin D deficiency       Medications:  Current Outpatient Medications   Medication Sig Dispense Refill    calcium-vitamin D 500 mg-5 mcg (200 unit) per tablet Take 1 tablet by mouth in the morning and 1 tablet in the evening. Take with meals.      empty container (SHARPS-A-GATOR DISPOSAL SYSTEM) Misc Use as directed for sharps disposal 1 each 2    evolocumab (REPATHA SURECLICK) 140 mg/mL PnIj Inject the contents of one pen (140 mg) under the skin every fourteen (14) days. 6 mL 2    fish oil-omega-3 fatty acids 300-1,000 mg capsule Take 2 capsules (2 g total) by mouth daily.      metoPROLOL succinate (TOPROL-XL) 50 MG 24 hr tablet Take 1 tablet (50 mg total) by mouth daily. 90 tablet 3    mometasone (NASONEX) 50 mcg/actuation nasal spray 2 sprays into each nostril daily.      omeprazole (PRILOSEC) 40 MG capsule Take 1 capsule (40 mg total) by mouth daily.      pedi multivit no.19-folic acid (CHILDREN'S MULTI-VIT GUMMIES) 200 mcg Chew Chew.      vitamin E, dl, acetate, 90 mg (409 unit) cap Take 1 capsule (200 Units total) by mouth.      ezetimibe (ZETIA) 10 mg tablet Take 1 tablet (10 mg total) by mouth daily. 90 tablet 3     No current facility-administered medications for this visit.       Allergies  Allergies   Allergen Reactions    Aspirin Hives and Rash  Sulfa (Sulfonamide Antibiotics) Rash    Latex, Natural Rubber     Statins-Hmg-Coa Reductase Inhibitors      Muscle pain    Penicillins Nausea And Vomiting and Rash     RXN AS A CHILD       Social History:   Social History     Tobacco Use    Smoking status: Never    Smokeless tobacco: Never       Family History:  Family History   Problem Relation Age of Onset    Heart failure Mother     Heart attack Mother 40    Liver cancer Father     Breast cancer Maternal Aunt        ROS- 12 system review is negative other than what is specified in the History of Present Illness.      Objective:   Physical Exam  Vitals:    07/18/23 1025   BP: 114/56   BP Site: R Arm   BP Position: Sitting   Pulse: 86   SpO2: 96%   Weight: 61.9 kg (136 lb 8 oz)     General-  Normal appearing female in no apparent distress.  Neurologic- Alert and oriented X3.  Cranial nerve II-XII grossly intact.  HEENT-  Normocephalic atraumatic head.  No scleral icterus.  MMM  Neck- Supple, no carotid bruis, no JVD  Lungs- Clear to auscultation, no wheezes, rhonchi, or rhales.  Heart- RRR, early peaking 2/6 SEM at LUSB  Extremities-  No clubbing or cyanosis.  No pitting edema to lower extremities bilaterally  Pulses-2+ pulses in radial and dorsalis pedis bilaterally.  Psych- Normal mood, appropriate.      Laboratory data:    I have personally reviewed the images of the following diagnostic studies.      Electrocardiogram:  EKG 01/08/2023 shows sinus rhythm with low voltage and nonspecific T wave abnormality    From 06/20/21 showed: Sinus rhythm with PVC.     Cardiac monitor:     From 2017 at [facility] showed: an average rate of PVCs and PACs. No sustained dysrhythmia.     Echocardiogram:  From 07/26/2021 showed: The left ventricle is normal in size with normal wall thickness. The left ventricular systolic function is normal, LVEF is visually  estimated at 55-60%. The aortic valve is trileaflet with mildly thickened leaflets with normal excursion. The right ventricle is normal in size, with normal systolic function.

## 2023-07-18 NOTE — Unmapped (Signed)
 Start zetia    Recheck LDL labs in 4-6 wks

## 2023-08-21 DIAGNOSIS — E7849 Other hyperlipidemia: Principal | ICD-10-CM

## 2023-08-22 ENCOUNTER — Ambulatory Visit (INDEPENDENT_AMBULATORY_CARE_PROVIDER_SITE_OTHER): Payer: Self-pay

## 2023-08-22 DIAGNOSIS — Z0000 Encounter for general adult medical examination without abnormal findings: Secondary | ICD-10-CM | POA: Diagnosis not present

## 2023-08-22 DIAGNOSIS — Z1231 Encounter for screening mammogram for malignant neoplasm of breast: Secondary | ICD-10-CM

## 2023-08-22 NOTE — Progress Notes (Signed)
 Subjective:   Mia Camacho is a 68 y.o. who presents for a Medicare Wellness preventive visit.  As a reminder, Annual Wellness Visits don't include a physical exam, and some assessments may be limited, especially if this visit is performed virtually. We may recommend an in-person follow-up visit with your provider if needed.  Visit Complete: Virtual I connected with  Frederico Jan on 08/22/23 by a audio enabled telemedicine application and verified that I am speaking with the correct person using two identifiers.  Patient Location: Home  Provider Location: Home Office  I discussed the limitations of evaluation and management by telemedicine. The patient expressed understanding and agreed to proceed.  Vital Signs: Because this visit was a virtual/telehealth visit, some criteria may be missing or patient reported. Any vitals not documented were not able to be obtained and vitals that have been documented are patient reported.  VideoDeclined- This patient declined Librarian, academic. Therefore the visit was completed with audio only.  Persons Participating in Visit: Patient.  AWV Questionnaire: No: Patient Medicare AWV questionnaire was not completed prior to this visit.  Cardiac Risk Factors include: advanced age (>42men, >66 women);dyslipidemia     Objective:    There were no vitals filed for this visit. There is no height or weight on file to calculate BMI.     08/22/2023    9:41 AM 08/17/2022    9:01 AM 08/21/2019    1:34 PM 05/25/2016    8:22 AM 11/25/2015    8:08 AM 10/25/2015    3:49 PM 07/22/2015    8:42 AM  Advanced Directives  Does Patient Have a Medical Advance Directive? No Yes Yes Yes  Yes  Yes  Yes   Type of Furniture conservator/restorer;Living will Healthcare Power of [place];Living will Healthcare Power of [place];Living will Healthcare Power of [place];Living will  Living will;Healthcare Power of [employer]  Power of [place];Living will   Does patient want to make changes to medical advance directive?      No - Patient declined    Copy of Healthcare Power of Attorney in Chart?     No - copy requested  No - copy requested  No - copy requested   Would patient like information on creating a medical advance directive? No - Patient declined           Data saved with a previous flowsheet row definition    Current Medications (verified) Outpatient Encounter Medications as of 08/22/2023  Medication Sig   Magnesium 250 MG TABS Take 250 mg by mouth daily.   metoprolol  succinate (TOPROL -XL) 50 MG 24 hr tablet Take 50 mg by mouth daily. Take with or immediately following a meal.   mometasone  (NASONEX ) 50 MCG/ACT nasal spray PLACE 2 SPRAYS INTO THE NOSE DAILY.   Multiple Vitamin (MULTIVITAMIN) capsule Take 1 capsule by mouth daily.   REPATHA  SURECLICK 140 MG/ML SOAJ Inject 1 mL into the skin every 14 (fourteen) days.   azithromycin  (ZITHROMAX ) 500 MG tablet Take 1 tablet (500 mg total) by mouth daily. (Patient not taking: Reported on 08/22/2023)   benzonatate  (TESSALON ) 100 MG capsule Take 2 capsules (200 mg total) by mouth 2 (two) times daily as needed for cough. (Patient not taking: Reported on 08/22/2023)   cholecalciferol (VITAMIN D ) 1000 units tablet Take 1,000 Units by mouth as needed. (Patient not taking: Reported on 08/22/2023)   Omega-3 Fatty Acids (FISH OIL OMEGA-3 PO) Take by mouth daily. (Patient  not taking: Reported on 08/22/2023)   omeprazole  (PRILOSEC) 40 MG capsule TAKE 1 CAPSULE (40 MG TOTAL) BY MOUTH DAILY. (Patient not taking: Reported on 08/22/2023)   promethazine -dextromethorphan (PROMETHAZINE -DM) 6.25-15 MG/5ML syrup Take 5 mLs by mouth 4 (four) times daily as needed for cough. (Patient not taking: Reported on 08/22/2023)   tiZANidine  (ZANAFLEX ) 4 MG tablet TAKE 1 TABLET (4 MG TOTAL) BY MOUTH 3 (THREE) TIMES DAILY. (Patient not taking: Reported on 08/22/2023)   vitamin E 200 UNIT capsule Take  200 Units by mouth daily. (Patient not taking: Reported on 08/22/2023)   No facility-administered encounter medications on file as of 08/22/2023.    Allergies (verified) Aspirin, Sulfa antibiotics, Penicillins, Statins, and Latex   History: Past Medical History:  Diagnosis Date   Graves disease    Hyperlipidemia    Past Surgical History:  Procedure Laterality Date   ABDOMINAL HYSTERECTOMY     bladder tact     ROTATOR CUFF REPAIR     Family History  Problem Relation Age of Onset   Hyperlipidemia Mother    Hypertension Mother    Diabetes Mother    Heart attack Mother 12   Heart failure Mother    Cancer Father    Social History   Socioeconomic History   Marital status: Married    Spouse name: Darrell    Number of children: 3   Years of education: Not on file   Highest education level: Some college, no degree  Occupational History   Not on file  Tobacco Use   Smoking status: Never   Smokeless tobacco: Never  Vaping Use   Vaping status: Never Used  Substance and Sexual Activity   Alcohol use: No    Alcohol/week: 0.0 standard drinks of alcohol   Drug use: No   Sexual activity: Yes    Partners: Male  Other Topics Concern   Not on file  Social History Narrative   Not on file   Social Drivers of Health   Financial Resource Strain: Low Risk  (08/22/2023)   Overall Financial Resource Strain (CARDIA)    Difficulty of Paying Living Expenses: Not hard at all  Food Insecurity: No Food Insecurity (08/22/2023)   Hunger Vital Sign    Worried About Running Out of Food in the Last Year: Never true    Ran Out of Food in the Last Year: Never true  Transportation Needs: No Transportation Needs (08/22/2023)   PRAPARE - Administrator, Civil Service (Medical): No    Lack of Transportation (Non-Medical): No  Physical Activity: Sufficiently Active (08/22/2023)   Exercise Vital Sign    Days of Exercise per Week: 3 days    Minutes of Exercise per Session: 90 min   Stress: No Stress Concern Present (08/22/2023)   [employer] of Occupational Health - Occupational Stress Questionnaire    Feeling of Stress: Not at all  Social Connections: Socially Integrated (08/22/2023)   Social Connection and Isolation Panel    Frequency of Communication with Friends and Family: More than three times a week    Frequency of Social Gatherings with Friends and Family: Three times a week    Attends Religious Services: More than 4 times per year    Active Member of Clubs or Organizations: Yes    Attends Banker Meetings: More than 4 times per year    Marital Status: Married    Tobacco Counseling Counseling given: Not Answered    Clinical Intake:  Pre-visit preparation completed: Yes  Pain : No/denies pain     BMI - recorded: 27.1 Nutritional Status: BMI 25 -29 Overweight Nutritional Risks: None Diabetes: No  Lab Results  Component Value Date   HGBA1C 5.5 04/05/2020   HGBA1C 5.4 11/25/2018   HGBA1C 5.5 04/23/2017     How often do you need to have someone help you when you read instructions, pamphlets, or other written materials from your doctor or pharmacy?: 1 - Never  Interpreter Needed?: No  Information entered by :: Dellie Fergusson, LPN   Activities of Daily Living     08/22/2023    9:42 AM  In your present state of health, do you have any difficulty performing the following activities:  Hearing? 0  Vision? 0  Difficulty concentrating or making decisions? 0  Walking or climbing stairs? 0  Dressing or bathing? 0  Doing errands, shopping? 0  Preparing Food and eating ? N  Using the Toilet? N  In the past six months, have you accidently leaked urine? N  Do you have problems with loss of bowel control? N  Managing your Medications? N  Managing your Finances? N  Housekeeping or managing your Housekeeping? N    Patient Care Team: Sowles, Krichna, MD as PCP - General (Family Medicine) Cara Chancellor Wiliam Harder, MD as Consulting  Physician (Cardiology) Bernell Brigham, MD as Referring Physician Cassie Click, MD (Inactive) as Consulting Physician (Gastroenterology) Julia Oats, OD as Consulting Physician (Optometry)  I have updated your Care Teams any recent Medical Services you may have received from other providers in the past year.     Assessment:   This is a routine wellness examination for Mia Camacho.  Hearing/Vision screen Hearing Screening - Comments:: NO AIDS Vision Screening - Comments:: GLASSES FOR DRIVING, READERS-[place]   Goals Addressed             This Visit's Progress    DIET - EAT MORE FRUITS AND VEGETABLES         Depression Screen     08/22/2023    9:39 AM 04/12/2023    9:52 AM 03/26/2023    9:45 AM 08/17/2022    8:57 AM 05/10/2022    9:32 AM 11/09/2021   10:08 AM 05/11/2021   10:23 AM  PHQ 2/9 Scores  PHQ - 2 Score 0 0 0 0 0 0 0  PHQ- 9 Score 0 0 0  0 0 0    Fall Risk     08/22/2023    9:42 AM 03/26/2023    9:44 AM 08/15/2022   10:21 AM 05/10/2022    9:31 AM 11/09/2021   10:08 AM  Fall Risk   Falls in the past year? 0 0 0 0 0  Number falls in past yr: 0 0 0 0 0  Injury with Fall? 0 0 0 0 0  Risk for fall due to : No Fall Risks No Fall Risks No Fall Risks No Fall Risks No Fall Risks  Follow up Falls evaluation completed Falls prevention discussed;Education provided;Falls evaluation completed Education provided;Falls prevention discussed Falls prevention discussed Falls prevention discussed      Data saved with a previous flowsheet row definition    MEDICARE RISK AT HOME:  Medicare Risk at Home Any stairs in or around the home?: Yes If so, are there any without handrails?: No Home free of loose throw rugs in walkways, pet beds, electrical cords, etc?: Yes Adequate lighting in your home to reduce risk of falls?: Yes Life alert?: No Use of  a cane, walker or w/c?: No Grab bars in the bathroom?: No Shower chair or bench in shower?: No Elevated toilet seat or a handicapped  toilet?: Yes  TIMED UP AND GO:  Was the test performed?  No  Cognitive Function: 6CIT completed        08/22/2023    9:44 AM 08/17/2022    9:07 AM  6CIT Screen  What Year? 0 points 0 points  What month? 0 points 0 points  What time? 0 points 0 points  Count back from 20 0 points 0 points  Months in reverse 0 points 0 points  Repeat phrase 0 points 0 points  Total Score 0 points 0 points    Immunizations Immunization History  Administered Date(s) Administered   Fluad Quad(high Dose 65+) 04/05/2020, 11/09/2021   Influenza,inj,Quad PF,6+ Mos 04/22/2017   Influenza-Unspecified 12/03/2013, 12/21/2014, 02/02/2018   Moderna Sars-Covid-2 Vaccination 04/15/2019, 05/13/2019   Tdap 06/28/2009, 08/24/2019   Zoster Recombinant(Shingrix ) 10/21/2018, 07/03/2019    Screening Tests Health Maintenance  Topic Date Due   Pneumococcal Vaccine: 50+ Years (1 of 1 - PCV) Never done   COVID-19 Vaccine (3 - Moderna risk series) 06/10/2019   DEXA SCAN  Never done   MAMMOGRAM  08/24/2022   INFLUENZA VACCINE  10/04/2023   Medicare Annual Wellness (AWV)  08/21/2024   Colonoscopy  09/28/2025   DTaP/Tdap/Td (3 - Td or Tdap) 08/23/2029   Hepatitis C Screening  Completed   Zoster Vaccines- Shingrix   Completed   HPV VACCINES  Aged Out   Meningococcal B Vaccine  Aged Out    Health Maintenance  Health Maintenance Due  Topic Date Due   Pneumococcal Vaccine: 50+ Years (1 of 1 - PCV) Never done   COVID-19 Vaccine (3 - Moderna risk series) 06/10/2019   DEXA SCAN  Never done   MAMMOGRAM  08/24/2022   Health Maintenance Items Addressed: Mammogram ordered; UP  TO DATE ON BDS & COLONOSCOPY; UP TO DATE ON TDAP & SHINGRIX ; NEEDS PNA, WANTS NO MORE COVID SHOTS  Additional Screening:  Vision Screening: Recommended annual ophthalmology exams for early detection of glaucoma and other disorders of the eye. Would you like a referral to an eye doctor? No    Dental Screening: Recommended annual dental  exams for proper oral hygiene  Community Resource Referral / Chronic Care Management: CRR required this visit?  No   CCM required this visit?  No   Plan:    I have personally reviewed and noted the following in the patient's chart:   Medical and social history Use of alcohol, tobacco or illicit drugs  Current medications and supplements including opioid prescriptions. Patient is not currently taking opioid prescriptions. Functional ability and status Nutritional status Physical activity Advanced directives List of other physicians Hospitalizations, surgeries, and ER visits in previous 12 months Vitals Screenings to include cognitive, depression, and falls Referrals and appointments  In addition, I have reviewed and discussed with patient certain preventive protocols, quality metrics, and best practice recommendations. A written personalized care plan for preventive services as well as general preventive health recommendations were provided to patient.   Pinky Bright, LPN   [id number]/[id number]   After Visit Summary: (MyChart) Due to this being a telephonic visit, the after visit summary with patients personalized plan was offered to patient via MyChart   Notes: MAMMOGRAM ORDERED

## 2023-08-22 NOTE — Patient Instructions (Addendum)
 Mia Camacho , Thank you for taking time out of your busy schedule to complete your Annual Wellness Visit with me. I enjoyed our conversation and look forward to speaking with you again next year. I, as well as your care team,  appreciate your ongoing commitment to your health goals. Please review the following plan we discussed and let me know if I can assist you in the future.  REFERRAL FOR MAMMOGRAM SENT You have an order for:  []   2D Mammogram  [x]   3D Mammogram  []   Bone Density     Please call for appointment:  [facility]  [address] [place] [address] [phone] [facility] [address] [place], [place] [address] [phone] [facility] [address] Tracey Friday [place], [place] [address] [phone]   Make sure to wear two-piece clothing.  No lotions, powders, or deodorants the day of the appointment. Make sure to bring picture ID and insurance card.  Bring list of medications you are currently taking including any supplements.   Schedule your [place] screening mammogram through MyChart!   Log into your MyChart account.  Go to 'Visit' (or 'Appointments' if on mobile App) --> Schedule an Appointment  Under 'Select a Reason for Visit' choose the Mammogram Screening option.  Complete the pre-visit questions and select the time and place that best fits your schedule.   Follow up Visits: Next Medicare AWV with our clinical staff:   09/03/24 @ 9:30 AM BY PHONE Have you seen your provider in the last 6 months (3 months if uncontrolled diabetes)? Yes   Clinician Recommendations:  Aim for 30 minutes of exercise or brisk walking, 6-8 glasses of water, and 5 servings of fruits and vegetables each day. TAKE CARE!      This is a list of the screening recommended for you and due dates:  Health Maintenance  Topic Date Due   Pneumococcal Vaccine for age  over 53 (1 of 1 - PCV) Never done   COVID-19 Vaccine (3 - Moderna risk series) 06/10/2019   DEXA scan (bone density measurement)  Never done   Mammogram  08/24/2022   Flu Shot  10/04/2023   Medicare Annual Wellness Visit  08/21/2024   Colon Cancer Screening  09/28/2025   DTaP/Tdap/Td vaccine (3 - Td or Tdap) 08/23/2029   Hepatitis C Screening  Completed   Zoster (Shingles) Vaccine  Completed   HPV Vaccine  Aged Out   Meningitis B Vaccine  Aged Out    Advanced directives: (ACP Link)Information on Advanced Care Planning can be found at [place]  Secretary of [facility] Advance Health Care Directives Advance Health Care Directives. [email or web address]  Advance Care Planning is important because it:  [x]  Makes sure you receive the medical care that is consistent with your values, goals, and preferences  [x]  It provides guidance to your family and loved ones and reduces their decisional burden about whether or not they are making the right decisions based on your wishes.  Follow the link provided in your after visit summary or read over the paperwork we have mailed to you to help you started getting your Advance Directives in place. If you need assistance in completing these, please reach out to us  so that we can help you!

## 2023-08-26 DIAGNOSIS — E7849 Other hyperlipidemia: Principal | ICD-10-CM

## 2023-08-26 MED ORDER — REPATHA SURECLICK 140 MG/ML SUBCUTANEOUS PEN INJECTOR
SUBCUTANEOUS | 2 refills | 84.00000 days | Status: CP
Start: 2023-08-26 — End: 2023-08-26

## 2023-09-03 DIAGNOSIS — E7849 Other hyperlipidemia: Principal | ICD-10-CM

## 2023-09-03 MED ORDER — REPATHA SURECLICK 140 MG/ML SUBCUTANEOUS PEN INJECTOR
SUBCUTANEOUS | 2 refills | 84.00000 days | Status: CP
Start: 2023-09-03 — End: ?
  Filled 2023-09-04: qty 6, 84d supply, fill #0

## 2023-09-13 DIAGNOSIS — Z1231 Encounter for screening mammogram for malignant neoplasm of breast: Principal | ICD-10-CM

## 2023-09-16 ENCOUNTER — Inpatient Hospital Stay: Admit: 2023-09-16 | Discharge: 2023-09-16 | Payer: Medicare (Managed Care)

## 2023-09-16 DIAGNOSIS — Z1231 Encounter for screening mammogram for malignant neoplasm of breast: Secondary | ICD-10-CM | POA: Diagnosis not present

## 2023-11-01 DIAGNOSIS — R208 Other disturbances of skin sensation: Secondary | ICD-10-CM | POA: Diagnosis not present

## 2023-11-01 DIAGNOSIS — L814 Other melanin hyperpigmentation: Secondary | ICD-10-CM | POA: Diagnosis not present

## 2023-11-01 DIAGNOSIS — L2989 Other pruritus: Secondary | ICD-10-CM | POA: Diagnosis not present

## 2023-11-01 DIAGNOSIS — L821 Other seborrheic keratosis: Secondary | ICD-10-CM | POA: Diagnosis not present

## 2023-11-01 DIAGNOSIS — L820 Inflamed seborrheic keratosis: Secondary | ICD-10-CM | POA: Diagnosis not present

## 2023-11-18 DIAGNOSIS — E7849 Other hyperlipidemia: Principal | ICD-10-CM

## 2023-11-18 MED ORDER — REPATHA SURECLICK 140 MG/ML SUBCUTANEOUS PEN INJECTOR
SUBCUTANEOUS | 2 refills | 84.00000 days | Status: CP
Start: 2023-11-18 — End: ?
  Filled 2023-11-27: qty 6, 84d supply, fill #0

## 2023-11-27 DIAGNOSIS — H5213 Myopia, bilateral: Secondary | ICD-10-CM | POA: Diagnosis not present

## 2023-11-27 DIAGNOSIS — H2513 Age-related nuclear cataract, bilateral: Secondary | ICD-10-CM | POA: Diagnosis not present

## 2024-01-07 NOTE — Telephone Encounter (Signed)
 Pt called in. She reports that she had a failure of her Repatha  injection.    I returned her call and advised her that she should call manufacturer. They need info from patient re injection site, etc. They will then send a form for us  to complete per Niels. Patient is aware and verbalizes understanding.

## 2024-01-07 NOTE — Telephone Encounter (Signed)
 [employer] replacement form signed by Dr. [place] and faxed to [facility] at [phone], Ph:  [phone].

## 2024-01-20 NOTE — Telephone Encounter (Signed)
 Refaxed Repatha  replacment Rx.

## 2024-02-10 NOTE — Telephone Encounter (Signed)
 PA Repatha  having labs abd office visit next week then complete PA

## 2024-02-17 ENCOUNTER — Encounter
Admit: 2024-02-17 | Discharge: 2024-02-17 | Payer: Medicare (Managed Care) | Attending: Adult Health | Primary: Adult Health

## 2024-02-17 NOTE — Progress Notes (Signed)
 Pt has noticed a little more sob with steps and hills.  She does walk on her treadmill three days a week with no incline and does not have sob.

## 2024-02-17 NOTE — Progress Notes (Signed)
 Assessment/Plan   1. Palpitations  Skipped beats largely well controlled with toprol  50 mg daily. Having some nocturnal episodes, will adjust timing of dose.     2. Familial hyperlipidemia, high LDL  Hx of statin intolerance.  Prior LDL up to 287.   Currently on repatha . Has not yet started zetia  but planning to.   - LDL Cholesterol, Direct   - Lp(a)  - discussed importance of adult children having lipid screening  - having slight dyspnea when going up inclines. Consider CCTA for further evaluation. EKG non-ischemic    Lab Results   Component Value Date    LDL 140.0 01/08/2023     3. Systolic murmur  Documented on prior clinic encounters. 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     Return in about 1 year (around 02/16/2025).     Calton JINNY Helena, AGNP-C  Cardiology Nurse Practitioner  [facility]    Subjective:   ERE:Dntozd, Dorette Orem, MD  Chief complaint:  68 y.o. female with a past medical history of Graves disease, FH, palpitations who presents for follow-up    History of Present Illness:    Per chart review, the patient the patient was followed by [facility] since 2017, she was diagnosed with PVCs and PACs. This has been controlled with metoprolol . She has hx of familial hyperlipidemia with prior LDL up to 287. She was unable to tolerate statins. She is on repatha .    LCV 07/18/23 - started zetia .   History of Present Illness  Azaliyah Kennard is a 68 year old female with hyperlipidemia who presents for follow-up regarding her cholesterol management.    She has been prescribed Zetia  for her hyperlipidemia but has not started taking it.   She is currently taking metoprolol  at 9 PM, which helps manage her palpitations. She experiences palpitations primarily when lying down at night, which sometimes requires her to sit up or change positions. Lying on her right side alleviates the palpitations.    She exercises regularly, going to the gym three times a week and walking on the treadmill for about 30 minutes at a speed of 2 to 2.5 miles per hour. She gets slightly winded when going uphill or climbing stairs but does not experience this on the treadmill. No chest discomfort or tightness, although she notes a slight tightening when climbing stairs.    She has three children and six grandchildren, and she plans to check if her children have had their cholesterol screened.  Mom fatal MI 75.     Past Medical History  Patient Active Problem List   Diagnosis    Familial hyperlipidemia, high LDL    Gastroesophageal reflux disease with hiatal hernia    History of Graves' disease    Palpitations    Vitamin D deficiency    Systolic murmur       Medications:  Current Outpatient Medications   Medication Sig Dispense Refill    calcium-vitamin D 500 mg-5 mcg (200 unit) per tablet Take 1 tablet by mouth in the morning and 1 tablet in the evening. Take with meals.      evolocumab  (REPATHA  SURECLICK) 140 mg/mL PnIj Inject the contents of one pen (140 mg) under the skin every fourteen (14) days. 6 mL 2    metoPROLOL  succinate (TOPROL -XL) 50 MG 24 hr tablet Take 1 tablet (50 mg total) by mouth daily. 90 tablet 3    mometasone (NASONEX) 50 mcg/actuation nasal spray 2 sprays into each nostril  daily.      omeprazole (PRILOSEC) 40 MG capsule Take 1 capsule (40 mg total) by mouth daily.      pedi multivit no.19-folic acid (CHILDREN'S MULTI-VIT GUMMIES) 200 mcg Chew Chew.      empty container (SHARPS-A-GATOR DISPOSAL SYSTEM) Misc Use as directed for sharps disposal 1 each 2    ezetimibe  (ZETIA ) 10 mg tablet Take 1 tablet (10 mg total) by mouth daily. (Patient not taking: Reported on 02/17/2024) 90 tablet 3     No current facility-administered medications for this visit.       Allergies  Allergies   Allergen Reactions    Aspirin Hives and Rash    Sulfa (Sulfonamide Antibiotics) Rash    Latex, Natural Rubber     Statins-Hmg-Coa Reductase Inhibitors      Muscle pain    Penicillins Nausea And Vomiting and Rash     RXN AS A CHILD Social History:   Social History     Tobacco Use    Smoking status: Never    Smokeless tobacco: Never       Family History:  Family History   Problem Relation Age of Onset    Heart failure Mother     Heart attack Mother 74    Liver cancer Father     Breast cancer Maternal Aunt        ROS- 12 system review is negative other than what is specified in the History of Present Illness.      Objective:   Physical Exam  Vitals:    02/17/24 1025   BP: 112/55   Pulse: 72   Resp: 20   Temp: 36.4 ??C (97.6 ??F)   SpO2: 96%   Weight: 64.2 kg (141 lb 8 oz)   Height: 149.9 cm (4' 11)     General-  Normal appearing female in no apparent distress.  Neurologic- Alert and oriented X3.  Cranial nerve II-XII grossly intact.  HEENT-  Normocephalic atraumatic head.  No scleral icterus.  MMM  Neck- Supple, no carotid bruis, no JVD  Lungs- Clear to auscultation, no wheezes, rhonchi, or rhales.  Heart- RRR, early peaking 2/6 SEM at LUSB  Extremities-  No clubbing or cyanosis.  No pitting edema to lower extremities bilaterally  Pulses-2+ pulses in radial and dorsalis pedis bilaterally.  Psych- Normal mood, appropriate.      Laboratory data:    I have personally reviewed the images of the following diagnostic studies.      Electrocardiogram:  EKG 01/08/2023 shows sinus rhythm with low voltage and nonspecific T wave abnormality    From 06/20/21 showed: Sinus rhythm with PVC.     02/17/24 - NSR, low voltage qrs, nonspecific ST/T wave abnormality    Cardiac monitor:     From 2017 at [facility] showed: an average rate of PVCs and PACs. No sustained dysrhythmia.     Echocardiogram:  From 07/26/2021 showed: The left ventricle is normal in size with normal wall thickness. The left ventricular systolic function is normal, LVEF is visually  estimated at 55-60%. The aortic valve is trileaflet with mildly thickened leaflets with normal excursion. The right ventricle is normal in size, with normal systolic function.

## 2024-02-17 NOTE — Patient Instructions (Addendum)
 Get labs    Start zetia     Can consider coronary CT scan

## 2024-02-20 LAB — LIPOPROTEIN A (LP(A)): LIPOPROTEIN A: 156 nmol/L — ABNORMAL HIGH

## 2024-02-28 ENCOUNTER — Ambulatory Visit: Admit: 2024-02-28 | Discharge: 2024-02-29 | Payer: Medicare (Managed Care)

## 2024-02-28 DIAGNOSIS — E7849 Other hyperlipidemia: Principal | ICD-10-CM

## 2024-02-28 LAB — LIPID PANEL
CHOLESTEROL: 200 mg/dL — ABNORMAL HIGH (ref <200)
HDL CHOLESTEROL: 65 mg/dL (ref >50)
LDL CHOLESTEROL CALCULATED: 93 mg/dL (ref <100)
NON-HDL CHOLESTEROL: 135 mg/dL — ABNORMAL HIGH (ref <130)
TRIGLYCERIDES: 273 mg/dL — ABNORMAL HIGH (ref <150)

## 2024-02-28 NOTE — Telephone Encounter (Signed)
 PA for Repatha  renewal faxed to [phone], ph:  [phone].

## 2024-03-04 MED FILL — REPATHA SURECLICK 140 MG/ML SUBCUTANEOUS PEN INJECTOR: SUBCUTANEOUS | 84 days supply | Qty: 6 | Fill #1

## 2024-09-03 ENCOUNTER — Ambulatory Visit
